# Patient Record
Sex: Female | Born: 1965 | Race: White | Hispanic: No | Marital: Married | Smoking: Former smoker
Health system: Southern US, Community
[De-identification: ages and names within clinical notes are randomized; demographics above are authoritative.]

## PROBLEM LIST (undated history)

## (undated) DIAGNOSIS — E559 Vitamin D deficiency, unspecified: Secondary | ICD-10-CM

## (undated) DIAGNOSIS — N939 Abnormal uterine and vaginal bleeding, unspecified: Secondary | ICD-10-CM

## (undated) DIAGNOSIS — R609 Edema, unspecified: Secondary | ICD-10-CM

## (undated) DIAGNOSIS — A64 Unspecified sexually transmitted disease: Secondary | ICD-10-CM

## (undated) DIAGNOSIS — IMO0002 Reserved for concepts with insufficient information to code with codable children: Secondary | ICD-10-CM

## (undated) HISTORY — DX: Unspecified sexually transmitted disease: A64

## (undated) HISTORY — PX: CLAVICLE SURGERY: SHX598

## (undated) HISTORY — DX: Vitamin D deficiency, unspecified: E55.9

## (undated) HISTORY — DX: Abnormal uterine and vaginal bleeding, unspecified: N93.9

## (undated) HISTORY — DX: Reserved for concepts with insufficient information to code with codable children: IMO0002

## (undated) HISTORY — DX: Edema, unspecified: R60.9

---
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
## 1988-04-05 DIAGNOSIS — IMO0002 Reserved for concepts with insufficient information to code with codable children: Secondary | ICD-10-CM

## 1988-04-05 DIAGNOSIS — R87619 Unspecified abnormal cytological findings in specimens from cervix uteri: Secondary | ICD-10-CM

## 1988-04-05 HISTORY — DX: Unspecified abnormal cytological findings in specimens from cervix uteri: R87.619

## 1988-04-05 HISTORY — PX: GYNECOLOGIC CRYOSURGERY: SHX857

## 1988-04-05 HISTORY — DX: Reserved for concepts with insufficient information to code with codable children: IMO0002

## 1989-04-05 DIAGNOSIS — A64 Unspecified sexually transmitted disease: Secondary | ICD-10-CM

## 1989-04-05 HISTORY — DX: Unspecified sexually transmitted disease: A64

## 1997-07-18 ENCOUNTER — Inpatient Hospital Stay (HOSPITAL_COMMUNITY): Admission: AD | Admit: 1997-07-18 | Discharge: 1997-07-18 | Payer: Self-pay | Admitting: Obstetrics and Gynecology

## 1997-07-20 ENCOUNTER — Inpatient Hospital Stay (HOSPITAL_COMMUNITY): Admission: AD | Admit: 1997-07-20 | Discharge: 1997-07-22 | Payer: Self-pay | Admitting: Gynecology

## 1997-07-31 ENCOUNTER — Encounter: Admission: RE | Admit: 1997-07-31 | Discharge: 1997-10-29 | Payer: Self-pay | Admitting: Obstetrics and Gynecology

## 1999-04-06 HISTORY — PX: TUBAL LIGATION: SHX77

## 1999-06-08 ENCOUNTER — Other Ambulatory Visit: Admission: RE | Admit: 1999-06-08 | Discharge: 1999-06-08 | Payer: Self-pay | Admitting: Gynecology

## 1999-12-24 ENCOUNTER — Inpatient Hospital Stay (HOSPITAL_COMMUNITY): Admission: AD | Admit: 1999-12-24 | Discharge: 1999-12-27 | Payer: Self-pay | Admitting: Gynecology

## 1999-12-24 ENCOUNTER — Encounter (INDEPENDENT_AMBULATORY_CARE_PROVIDER_SITE_OTHER): Payer: Self-pay | Admitting: Specialist

## 2005-03-01 ENCOUNTER — Other Ambulatory Visit: Admission: RE | Admit: 2005-03-01 | Discharge: 2005-03-01 | Payer: Self-pay | Admitting: Gynecology

## 2007-05-31 ENCOUNTER — Encounter: Admission: RE | Admit: 2007-05-31 | Discharge: 2007-05-31 | Payer: Self-pay | Admitting: Family Medicine

## 2008-04-05 HISTORY — PX: WRIST SURGERY: SHX841

## 2009-01-10 ENCOUNTER — Ambulatory Visit (HOSPITAL_COMMUNITY): Admission: RE | Admit: 2009-01-10 | Discharge: 2009-01-12 | Payer: Self-pay | Admitting: Surgery

## 2009-04-05 HISTORY — PX: CARPAL TUNNEL RELEASE: SHX101

## 2009-04-25 ENCOUNTER — Ambulatory Visit (HOSPITAL_COMMUNITY): Admission: RE | Admit: 2009-04-25 | Discharge: 2009-04-25 | Payer: Self-pay | Admitting: Orthopaedic Surgery

## 2010-06-21 LAB — CBC
HCT: 39 % (ref 36.0–46.0)
Hemoglobin: 13.4 g/dL (ref 12.0–15.0)
MCHC: 34.4 g/dL (ref 30.0–36.0)
MCV: 87 fL (ref 78.0–100.0)
Platelets: 213 10*3/uL (ref 150–400)
RBC: 4.49 MIL/uL (ref 3.87–5.11)
RDW: 13.7 % (ref 11.5–15.5)
WBC: 5.6 10*3/uL (ref 4.0–10.5)

## 2010-07-09 LAB — CBC
HCT: 34.9 % — ABNORMAL LOW (ref 36.0–46.0)
Hemoglobin: 11.8 g/dL — ABNORMAL LOW (ref 12.0–15.0)
MCHC: 33.7 g/dL (ref 30.0–36.0)
MCV: 88.2 fL (ref 78.0–100.0)
Platelets: 215 10*3/uL (ref 150–400)
RBC: 3.96 MIL/uL (ref 3.87–5.11)
RDW: 13.7 % (ref 11.5–15.5)
WBC: 6.2 10*3/uL (ref 4.0–10.5)

## 2010-08-21 NOTE — Op Note (Signed)
[facility] [address] [facility]  Patient:    Ariel Ellis, Ariel Ellis                       MRN: [id number] Proc. Date: 12/24/99 Adm. Date:  [id number] Attending:  Douglass Rivers                           Operative Report  PREOPERATIVE DIAGNOSIS:       1. Herpes outbreak at term.                               2. Undesired fertility.  POSTOPERATIVE DIAGNOSIS:      1. Herpes outbreak at term.                               2. Undesired fertility.  OPERATION:                    1. Primary cesarean section, low flap,                                  transverse.                               2. Modified Pomeroy bilateral tubal ligation.  SURGEON:                      Douglass Rivers, M.D.  ASSISTANT:                    Nadyne Coombes. Fontaine, M.D.  ANESTHESIA:                   Spinal.  ESTIMATED BLOOD LOSS:         500 cc.  URINE OUTPUT:                 220 cc of clear urine.  IV FLUIDS:                    2100 cc lactated ringers.  FINDINGS:                     Viable female infant in the vertex presentation. Clear amniotic fluid.  Apgars 9/9.  Birth weight was 7/6.  Normal uterus, tubes, and ovaries.  COMPLICATIONS:                None.  PATHOLOGY:                    Submitted portion of left and right tubes.  DESCRIPTION OF PROCEDURE:     The patient was taken to the operating room. Spinal anesthesia was induced, and the patient was placed in the supine position with left lateral displacement, prepped and draped in usual sterile fashion.  A Pfannenstiel skin incision was made with the scalpel and carried through layer of fascia with electrocautery.  The fascia was scored in the midline and incision was extended laterally.  Mayo scissors in periosteum, fascial incision grasped with Kochers, underlying rectus muscles dissected off using blunt and sharp dissection.  In a similar fashion, superior aspect of incision was grasped with Kochers, underlying rectus  muscles dissected  off. The rectus muscle separated in the midline.  The peritoneum identified and entered by sharp dissection.  The peritoneal incision was extended superiorly to inferiorly with good visualization underlying bowel and bladder. Orientation of the uterus was confirmed and subsequently uterine peritoneum was identified, entered sharply with the Metzenbaum scissors.  Bladder flap was created digitally.  The bladder blade was inserted in the lower uterine segment incised in transverse fashion with scalpel. Clear amniotic fluid was noted upon entering the cavity.  The infant was in the vertex presentation. Cord was cut and clamped.  The infant was handed off to waiting pediatricians. Cord bloods were obtained.  The uterus was massaged, and the placenta was allowed to separate naturally.  The uterus was cleared of all clots and debris.  The uterine incision was repaired with a running lock layer of 0 chromic.  The incision was noted to have a small area of bleeding, and a second single suture figure-of-eight was placed in the mid portion of the incision, and hemostasis was assured.  The uterus was rotated.  The tube was visualized.  The mid portion of the tube was elevated, and two free ties of 2-0 plain were placed, and the mid portion of the tube was excised.  The ends were crossed with a hemostat, and the endosalpinx was excised.  The tube was noted to be hemostatic and was returned back to the abdomen in similar fashion.  The uterus was externally rotated, and the mid portion of the tube was excised.  The pelvis was then irrigated with copious amounts of warm saline.  Hemostasis of the incision was ensured.  There was some mild bleeding underneath the bladder flap which was treated with electrocautery, and hemostasis was assured.  The perineum and muscles were noted to be hemostatic. The fascia was closed with 0 Vicryl starting at the apex and moving towards the midline bilaterally.  The  subcutaneous tissue was irrigated.  Areas of bleeding were treated with electrocautery.  The skin was closed with staples. The patient tolerated the procedure well.  Sponge counts were correct x 2. She was transferred to the PACU in stable condition. DD:  12/24/99 TD:  12/26/99 Job: [id number]

## 2010-08-21 NOTE — Discharge Summary (Signed)
[facility]  Patient:    Ariel Ellis, Ariel Ellis                       MRN: [id number] Adm. Date:  [id number] Disc. Date: [id number] Attending:  Douglass Rivers Dictator:   Antony Contras, [facility]                           Discharge Summary  DISCHARGE DIAGNOSES:          1. Intrauterine pregnancy at 21 and 3/7 weeks.                               2. History of herpes simplex virus with outbreak t                                  37 and 3/7 weeks.  PROCEDURES:                   1. Low cervical transverse cesarean section with                                  delivery of a viable infant.                               2. Tubal sterilization, modified Pomeroy technique.  HISTORY OF PRESENT ILLNESS:   The patient is a 45 year old, gravida 4, para 2-0-1-2 with an LMP March 29, 1999, [facility] January 03, 2000.  Prenatal risk factors include history of HSV, history of PIH with a previous pregnancy.  PRENATAL LABORATORY DATA:     Blood type A positive, antibody screen negative, PR, HBsAg, and HIV nonreactive, rubella immune.  The patient declined MS-AFP.  GBS s negative.  HOSPITAL COURSE:              The patient was admitted for elective cesarean section at 45 and 3/7 weeks after experiencing a recurrence of her HSV at 37 and 3/7 weeks which was resolved at the time of admission for surgery.  Low cervical transverse cesarean section with modified Pomeroy sterilization was performed by Dr. Farrel Gobble assisted by Dr. Audie Box under spinal anesthesia.  The  patient was delivered of an Apgar of 9/9 female infant weighing 7 pounds 6 ounces. Pathology revealed normal uterus, tubes, and ovaries.  Postpartum course:  The patient remained afebrile, had no difficulty voiding, and was able to be discharged on her third postoperative day in satisfactory condition.  LABORATORY DATA:              CBC:  Hematocrit 32.6, hemoglobin 11.3, WBC 7.8, platelets 147.  FOLLOWUP:                      Follow up in the office in six weeks.  DISCHARGE MEDICATIONS:        Continue prenatal vitamins and iron.  DISCHARGE INSTRUCTIONS:       Remove staples next week. DD:  01/19/00 TD:  01/19/00 Job: [id number]

## 2012-05-06 DIAGNOSIS — N939 Abnormal uterine and vaginal bleeding, unspecified: Secondary | ICD-10-CM

## 2012-05-06 HISTORY — DX: Abnormal uterine and vaginal bleeding, unspecified: N93.9

## 2012-05-10 LAB — HM PAP SMEAR: HM Pap smear: NEGATIVE

## 2012-05-31 HISTORY — PX: ENDOMETRIAL BIOPSY: SHX622

## 2012-07-13 ENCOUNTER — Encounter: Payer: Self-pay | Admitting: Obstetrics and Gynecology

## 2012-07-14 ENCOUNTER — Ambulatory Visit (INDEPENDENT_AMBULATORY_CARE_PROVIDER_SITE_OTHER): Payer: BC Managed Care – PPO | Admitting: Obstetrics and Gynecology

## 2012-07-14 ENCOUNTER — Encounter: Payer: Self-pay | Admitting: Obstetrics and Gynecology

## 2012-07-14 VITALS — BP 118/70 | Wt 202.0 lb

## 2012-07-14 DIAGNOSIS — Z30431 Encounter for routine checking of intrauterine contraceptive device: Secondary | ICD-10-CM

## 2012-07-14 NOTE — Patient Instructions (Signed)
Return for annual exam in Feb 2015.    Feel for the strings of your IUD every month and report to [place] if you do not feel them.

## 2012-07-14 NOTE — Progress Notes (Signed)
47 yo MWF G4P3 here for IUD check.  Had Mirena inserted 06/14/2012 for tx of menorrhagia after nl PUS, SHSG, and endo bx.  Has BTSP so doesn't need it for birth control.  No trouble with cramping or bleeding after insertion. Had a very light period since insertion.  Pt pleased with results.    Exam:  abd soft, NT, no masses.  Pelvic:  Ext nl.  Vag nl.  Cx nl, iud strings visualized.  Bimanual exam:  Uterus ant, small, NT, mobile.  Adnexa nl.  A:  Nl post IUD exam  P:  Pt instructed to feel for strings q month.  Return if menorrhagia doesn't respond to IUD.  Keep routine exam appt in Feb 2015.

## 2013-05-16 ENCOUNTER — Ambulatory Visit: Payer: Self-pay | Admitting: Obstetrics and Gynecology

## 2013-05-17 ENCOUNTER — Encounter: Payer: Self-pay | Admitting: Obstetrics and Gynecology

## 2013-05-17 ENCOUNTER — Ambulatory Visit (INDEPENDENT_AMBULATORY_CARE_PROVIDER_SITE_OTHER): Payer: BC Managed Care – PPO | Admitting: Obstetrics and Gynecology

## 2013-05-17 VITALS — BP 127/69 | HR 70 | Resp 16 | Ht 64.5 in | Wt 177.0 lb

## 2013-05-17 DIAGNOSIS — Z0000 Encounter for general adult medical examination without abnormal findings: Secondary | ICD-10-CM

## 2013-05-17 DIAGNOSIS — Z01419 Encounter for gynecological examination (general) (routine) without abnormal findings: Secondary | ICD-10-CM

## 2013-05-17 LAB — POCT URINALYSIS DIPSTICK
Bilirubin, UA: NEGATIVE
Blood, UA: NEGATIVE
Glucose, UA: NEGATIVE
Ketones, UA: NEGATIVE
Leukocytes, UA: NEGATIVE
Nitrite, UA: NEGATIVE
Protein, UA: NEGATIVE
Urobilinogen, UA: NEGATIVE
pH, UA: 6

## 2013-05-17 LAB — LIPID PANEL
Cholesterol: 173 mg/dL (ref 0–200)
HDL: 59 mg/dL (ref >39)
LDL Cholesterol: 94 mg/dL (ref 0–99)
Total CHOL/HDL Ratio: 2.9 Ratio
Triglycerides: 98 mg/dL (ref <150)
VLDL: 20 mg/dL (ref 0–40)

## 2013-05-17 LAB — CBC
HCT: 39.1 % (ref 36.0–46.0)
Hemoglobin: 13.1 g/dL (ref 12.0–15.0)
MCH: 28.8 pg (ref 26.0–34.0)
MCHC: 33.5 g/dL (ref 30.0–36.0)
MCV: 85.9 fL (ref 78.0–100.0)
Platelets: 253 10*3/uL (ref 150–400)
RBC: 4.55 MIL/uL (ref 3.87–5.11)
RDW: 14.5 % (ref 11.5–15.5)
WBC: 5.8 10*3/uL (ref 4.0–10.5)

## 2013-05-17 LAB — COMPREHENSIVE METABOLIC PANEL
ALT: 14 U/L (ref 0–35)
AST: 14 U/L (ref 0–37)
Albumin: 3.9 g/dL (ref 3.5–5.2)
Alkaline Phosphatase: 62 U/L (ref 39–117)
BUN: 11 mg/dL (ref 6–23)
CO2: 30 mEq/L (ref 19–32)
Calcium: 8.7 mg/dL (ref 8.4–10.5)
Chloride: 104 mEq/L (ref 96–112)
Creat: 0.8 mg/dL (ref 0.50–1.10)
Glucose, Bld: 83 mg/dL (ref 70–99)
Potassium: 4.5 mEq/L (ref 3.5–5.3)
Sodium: 141 mEq/L (ref 135–145)
Total Bilirubin: 0.3 mg/dL (ref 0.2–1.2)
Total Protein: 6.2 g/dL (ref 6.0–8.3)

## 2013-05-17 LAB — HEMOGLOBIN, FINGERSTICK: Hemoglobin, fingerstick: 13.1 g/dL (ref 12.0–16.0)

## 2013-05-17 NOTE — Progress Notes (Signed)
Appointment made for screening Mammogram. Appointment made with patient in office for  [facility]  patient agreeable to time/date.
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
## 2013-05-17 NOTE — Patient Instructions (Signed)

## 2013-05-24 ENCOUNTER — Ambulatory Visit: Payer: Self-pay

## 2013-06-05 ENCOUNTER — Ambulatory Visit
Admission: RE | Admit: 2013-06-05 | Discharge: 2013-06-05 | Disposition: A | Discharge status: Home / Self Care | Payer: BC Managed Care – PPO | Source: Ambulatory Visit | Attending: Obstetrics and Gynecology | Admitting: Obstetrics and Gynecology

## 2013-06-05 DIAGNOSIS — Z01419 Encounter for gynecological examination (general) (routine) without abnormal findings: Secondary | ICD-10-CM

## 2014-01-28 ENCOUNTER — Telehealth: Payer: Self-pay | Admitting: Obstetrics and Gynecology

## 2014-01-28 NOTE — Telephone Encounter (Signed)
LMTCB to reschedule AEX from 05/22/14 to another day.

## 2014-02-04 ENCOUNTER — Encounter: Payer: Self-pay | Admitting: Obstetrics and Gynecology

## 2014-05-22 ENCOUNTER — Ambulatory Visit: Payer: BC Managed Care – PPO | Admitting: Obstetrics and Gynecology

## 2015-05-02 ENCOUNTER — Encounter: Payer: Self-pay | Admitting: Obstetrics and Gynecology

## 2015-05-02 ENCOUNTER — Ambulatory Visit (INDEPENDENT_AMBULATORY_CARE_PROVIDER_SITE_OTHER): Payer: BLUE CROSS/BLUE SHIELD | Admitting: Obstetrics and Gynecology

## 2015-05-02 VITALS — BP 110/78 | HR 70 | Resp 16 | Ht 64.75 in | Wt 199.0 lb

## 2015-05-02 DIAGNOSIS — Z0000 Encounter for general adult medical examination without abnormal findings: Secondary | ICD-10-CM

## 2015-05-02 DIAGNOSIS — Z01419 Encounter for gynecological examination (general) (routine) without abnormal findings: Secondary | ICD-10-CM

## 2015-05-02 LAB — POCT URINALYSIS DIPSTICK
Bilirubin, UA: NEGATIVE
Blood, UA: NEGATIVE
Glucose, UA: NEGATIVE
Ketones, UA: NEGATIVE
Leukocytes, UA: NEGATIVE
Nitrite, UA: NEGATIVE
Protein, UA: NEGATIVE
Urobilinogen, UA: NEGATIVE
pH, UA: 5

## 2015-05-02 LAB — CBC
HCT: 40.3 % (ref 36.0–46.0)
Hemoglobin: 13.2 g/dL (ref 12.0–15.0)
MCH: 28.4 pg (ref 26.0–34.0)
MCHC: 32.8 g/dL (ref 30.0–36.0)
MCV: 86.9 fL (ref 78.0–100.0)
MPV: 9.6 fL (ref 8.6–12.4)
Platelets: 280 10*3/uL (ref 150–400)
RBC: 4.64 MIL/uL (ref 3.87–5.11)
RDW: 14.7 % (ref 11.5–15.5)
WBC: 5.7 10*3/uL (ref 4.0–10.5)

## 2015-05-02 LAB — COMPREHENSIVE METABOLIC PANEL
ALT: 14 U/L (ref 6–29)
AST: 15 U/L (ref 10–35)
Albumin: 4 g/dL (ref 3.6–5.1)
Alkaline Phosphatase: 74 U/L (ref 33–115)
BUN: 15 mg/dL (ref 7–25)
CO2: 25 mmol/L (ref 20–31)
Calcium: 8.7 mg/dL (ref 8.6–10.2)
Chloride: 104 mmol/L (ref 98–110)
Creat: 0.7 mg/dL (ref 0.50–1.10)
Glucose, Bld: 81 mg/dL (ref 65–99)
Potassium: 4.3 mmol/L (ref 3.5–5.3)
Sodium: 141 mmol/L (ref 135–146)
Total Bilirubin: 0.3 mg/dL (ref 0.2–1.2)
Total Protein: 6.3 g/dL (ref 6.1–8.1)

## 2015-05-02 LAB — LIPID PANEL
Cholesterol: 173 mg/dL (ref 125–200)
HDL: 60 mg/dL (ref >=46)
LDL Cholesterol: 93 mg/dL (ref <130)
Total CHOL/HDL Ratio: 2.9 Ratio (ref <=5.0)
Triglycerides: 101 mg/dL (ref <150)
VLDL: 20 mg/dL (ref <30)
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
## 2015-05-02 NOTE — Patient Instructions (Signed)

## 2015-05-03 LAB — TSH: TSH: 1.147 u[IU]/mL (ref 0.350–4.500)

## 2015-05-06 LAB — IPS PAP TEST WITH HPV
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
## 2018-01-06 ENCOUNTER — Other Ambulatory Visit: Payer: Self-pay | Admitting: Obstetrics and Gynecology

## 2018-01-06 ENCOUNTER — Other Ambulatory Visit (HOSPITAL_COMMUNITY)
Admission: RE | Admit: 2018-01-06 | Discharge: 2018-01-06 | Disposition: A | Discharge status: Home / Self Care | Payer: Managed Care, Other (non HMO) | Source: Ambulatory Visit | Attending: Obstetrics and Gynecology | Admitting: Obstetrics and Gynecology

## 2018-01-06 ENCOUNTER — Ambulatory Visit (INDEPENDENT_AMBULATORY_CARE_PROVIDER_SITE_OTHER): Payer: Managed Care, Other (non HMO) | Admitting: Obstetrics and Gynecology

## 2018-01-06 ENCOUNTER — Encounter: Payer: Self-pay | Admitting: Obstetrics and Gynecology

## 2018-01-06 ENCOUNTER — Other Ambulatory Visit: Payer: Self-pay

## 2018-01-06 VITALS — BP 118/74 | HR 83 | Ht 64.5 in | Wt 224.0 lb

## 2018-01-06 DIAGNOSIS — Z01419 Encounter for gynecological examination (general) (routine) without abnormal findings: Secondary | ICD-10-CM | POA: Diagnosis present

## 2018-01-06 DIAGNOSIS — Z1231 Encounter for screening mammogram for malignant neoplasm of breast: Secondary | ICD-10-CM

## 2018-01-06 DIAGNOSIS — N939 Abnormal uterine and vaginal bleeding, unspecified: Secondary | ICD-10-CM

## 2018-01-06 DIAGNOSIS — R635 Abnormal weight gain: Secondary | ICD-10-CM

## 2018-01-06 DIAGNOSIS — Z1211 Encounter for screening for malignant neoplasm of colon: Secondary | ICD-10-CM

## 2018-01-06 NOTE — Patient Instructions (Signed)

## 2018-01-07 LAB — COMPREHENSIVE METABOLIC PANEL
ALT: 19 IU/L (ref 0–32)
AST: 16 IU/L (ref 0–40)
Albumin/Globulin Ratio: 2 (ref 1.2–2.2)
Albumin: 4.3 g/dL (ref 3.5–5.5)
Alkaline Phosphatase: 98 IU/L (ref 39–117)
BUN/Creatinine Ratio: 22 (ref 9–23)
BUN: 16 mg/dL (ref 6–24)
Bilirubin Total: <0.2 mg/dL (ref 0.0–1.2)
CO2: 25 mmol/L (ref 20–29)
Calcium: 9 mg/dL (ref 8.7–10.2)
Chloride: 104 mmol/L (ref 96–106)
Creatinine, Ser: 0.74 mg/dL (ref 0.57–1.00)
GFR calc Af Amer: 108 mL/min/{1.73_m2} (ref >59)
GFR calc non Af Amer: 93 mL/min/{1.73_m2} (ref >59)
Globulin, Total: 2.1 g/dL (ref 1.5–4.5)
Glucose: 80 mg/dL (ref 65–99)
Potassium: 4.7 mmol/L (ref 3.5–5.2)
Sodium: 142 mmol/L (ref 134–144)
Total Protein: 6.4 g/dL (ref 6.0–8.5)

## 2018-01-07 LAB — LIPID PANEL
Chol/HDL Ratio: 3.5 ratio (ref 0.0–4.4)
Cholesterol, Total: 188 mg/dL (ref 100–199)
HDL: 53 mg/dL (ref >39)
LDL Calculated: 111 mg/dL — ABNORMAL HIGH (ref 0–99)
Triglycerides: 121 mg/dL (ref 0–149)
VLDL Cholesterol Cal: 24 mg/dL (ref 5–40)

## 2018-01-07 LAB — TSH: TSH: 1.35 u[IU]/mL (ref 0.450–4.500)

## 2018-01-07 LAB — FOLLICLE STIMULATING HORMONE: FSH: 96.9 m[IU]/mL

## 2018-01-07 LAB — CBC
Hematocrit: 41.2 % (ref 34.0–46.6)
Hemoglobin: 13.5 g/dL (ref 11.1–15.9)
MCH: 28.3 pg (ref 26.6–33.0)
MCHC: 32.8 g/dL (ref 31.5–35.7)
MCV: 86 fL (ref 79–97)
Platelets: 272 10*3/uL (ref 150–450)
RBC: 4.77 x10E6/uL (ref 3.77–5.28)
RDW: 13.2 % (ref 12.3–15.4)
WBC: 5.8 10*3/uL (ref 3.4–10.8)

## 2018-01-07 LAB — ESTRADIOL: Estradiol: 16.6 pg/mL

## 2018-01-10 ENCOUNTER — Other Ambulatory Visit: Payer: Self-pay | Admitting: *Deleted

## 2018-01-10 DIAGNOSIS — N950 Postmenopausal bleeding: Secondary | ICD-10-CM

## 2018-01-10 DIAGNOSIS — Z30432 Encounter for removal of intrauterine contraceptive device: Secondary | ICD-10-CM

## 2018-01-10 LAB — CYTOLOGY - PAP
Diagnosis: NEGATIVE
HPV: NOT DETECTED

## 2018-01-12 ENCOUNTER — Other Ambulatory Visit: Payer: Self-pay

## 2018-01-12 ENCOUNTER — Encounter: Payer: Self-pay | Admitting: Obstetrics and Gynecology

## 2018-01-12 ENCOUNTER — Ambulatory Visit: Payer: Managed Care, Other (non HMO) | Admitting: Obstetrics and Gynecology

## 2018-01-12 ENCOUNTER — Ambulatory Visit (INDEPENDENT_AMBULATORY_CARE_PROVIDER_SITE_OTHER): Payer: Managed Care, Other (non HMO)

## 2018-01-12 VITALS — BP 118/78 | HR 70 | Ht 64.5 in | Wt 224.0 lb

## 2018-01-12 DIAGNOSIS — N950 Postmenopausal bleeding: Secondary | ICD-10-CM

## 2018-01-12 DIAGNOSIS — Z0001 Encounter for general adult medical examination with abnormal findings: Secondary | ICD-10-CM | POA: Diagnosis not present

## 2018-01-12 DIAGNOSIS — Z30432 Encounter for removal of intrauterine contraceptive device: Secondary | ICD-10-CM | POA: Diagnosis not present

## 2018-01-12 NOTE — Patient Instructions (Signed)

## 2018-01-12 NOTE — Progress Notes (Signed)
Encounter reviewed by Dr. Magdalen Cabana Amundson C. Silva.  
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
## 2018-01-13 ENCOUNTER — Ambulatory Visit
Admission: RE | Admit: 2018-01-13 | Discharge: 2018-01-13 | Disposition: A | Discharge status: Home / Self Care | Payer: Managed Care, Other (non HMO) | Source: Ambulatory Visit | Attending: Obstetrics and Gynecology | Admitting: Obstetrics and Gynecology

## 2018-01-13 DIAGNOSIS — Z1231 Encounter for screening mammogram for malignant neoplasm of breast: Secondary | ICD-10-CM

## 2018-02-09 ENCOUNTER — Encounter: Payer: Self-pay | Admitting: Obstetrics and Gynecology

## 2018-02-16 ENCOUNTER — Encounter (INDEPENDENT_AMBULATORY_CARE_PROVIDER_SITE_OTHER): Payer: Managed Care, Other (non HMO)

## 2018-02-22 ENCOUNTER — Encounter (INDEPENDENT_AMBULATORY_CARE_PROVIDER_SITE_OTHER): Payer: Self-pay | Admitting: Bariatrics

## 2018-02-22 ENCOUNTER — Ambulatory Visit (INDEPENDENT_AMBULATORY_CARE_PROVIDER_SITE_OTHER): Payer: 59 | Admitting: Bariatrics

## 2018-02-22 VITALS — BP 123/78 | HR 82 | Temp 98.1°F | Ht 65.0 in | Wt 217.0 lb

## 2018-02-22 DIAGNOSIS — R0602 Shortness of breath: Secondary | ICD-10-CM

## 2018-02-22 DIAGNOSIS — R5383 Other fatigue: Secondary | ICD-10-CM

## 2018-02-22 DIAGNOSIS — Z0289 Encounter for other administrative examinations: Secondary | ICD-10-CM

## 2018-02-22 DIAGNOSIS — Z1331 Encounter for screening for depression: Secondary | ICD-10-CM

## 2018-02-22 DIAGNOSIS — Z9189 Other specified personal risk factors, not elsewhere classified: Secondary | ICD-10-CM

## 2018-02-22 DIAGNOSIS — Z6836 Body mass index (BMI) 36.0-36.9, adult: Secondary | ICD-10-CM

## 2018-02-23 LAB — HEMOGLOBIN A1C
Est. average glucose Bld gHb Est-mCnc: 120 mg/dL
Hgb A1c MFr Bld: 5.8 % — ABNORMAL HIGH (ref 4.8–5.6)

## 2018-02-23 LAB — INSULIN, RANDOM: INSULIN: 13.1 u[IU]/mL (ref 2.6–24.9)

## 2018-02-23 LAB — VITAMIN D 25 HYDROXY (VIT D DEFICIENCY, FRACTURES): Vit D, 25-Hydroxy: 23.7 ng/mL — ABNORMAL LOW (ref 30.0–100.0)

## 2018-02-28 DIAGNOSIS — Z6834 Body mass index (BMI) 34.0-34.9, adult: Secondary | ICD-10-CM

## 2018-02-28 DIAGNOSIS — E669 Obesity, unspecified: Secondary | ICD-10-CM | POA: Insufficient documentation
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
## 2018-03-08 ENCOUNTER — Ambulatory Visit (INDEPENDENT_AMBULATORY_CARE_PROVIDER_SITE_OTHER): Payer: Managed Care, Other (non HMO) | Admitting: Bariatrics

## 2018-03-15 ENCOUNTER — Ambulatory Visit (INDEPENDENT_AMBULATORY_CARE_PROVIDER_SITE_OTHER): Payer: 59 | Admitting: Family Medicine

## 2018-03-15 VITALS — BP 118/77 | HR 74 | Temp 98.1°F | Ht 65.0 in | Wt 216.0 lb

## 2018-03-15 DIAGNOSIS — E559 Vitamin D deficiency, unspecified: Secondary | ICD-10-CM | POA: Diagnosis not present

## 2018-03-15 DIAGNOSIS — Z9189 Other specified personal risk factors, not elsewhere classified: Secondary | ICD-10-CM

## 2018-03-15 DIAGNOSIS — Z6836 Body mass index (BMI) 36.0-36.9, adult: Secondary | ICD-10-CM

## 2018-03-15 DIAGNOSIS — R7303 Prediabetes: Secondary | ICD-10-CM | POA: Diagnosis not present

## 2018-03-15 MED ORDER — VITAMIN D (ERGOCALCIFEROL) 1.25 MG (50000 UNIT) PO CAPS: 50000.0000 [IU] | ORAL_CAPSULE | ORAL | 0 refills | Status: DC
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
## 2018-04-06 ENCOUNTER — Ambulatory Visit (INDEPENDENT_AMBULATORY_CARE_PROVIDER_SITE_OTHER): Payer: 59 | Admitting: Family Medicine

## 2018-04-06 ENCOUNTER — Encounter (INDEPENDENT_AMBULATORY_CARE_PROVIDER_SITE_OTHER): Payer: Self-pay | Admitting: Family Medicine

## 2018-04-06 VITALS — BP 116/69 | HR 74 | Temp 97.8°F | Ht 65.0 in | Wt 216.0 lb

## 2018-04-06 DIAGNOSIS — R7303 Prediabetes: Secondary | ICD-10-CM | POA: Diagnosis not present

## 2018-04-06 DIAGNOSIS — Z9189 Other specified personal risk factors, not elsewhere classified: Secondary | ICD-10-CM

## 2018-04-06 DIAGNOSIS — E559 Vitamin D deficiency, unspecified: Secondary | ICD-10-CM | POA: Diagnosis not present

## 2018-04-06 DIAGNOSIS — Z6835 Body mass index (BMI) 35.0-35.9, adult: Secondary | ICD-10-CM | POA: Diagnosis not present

## 2018-04-06 MED ORDER — VITAMIN D (ERGOCALCIFEROL) 1.25 MG (50000 UNIT) PO CAPS: 50000.0000 [IU] | ORAL_CAPSULE | ORAL | 0 refills | Status: DC
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
## 2018-04-18 ENCOUNTER — Encounter (INDEPENDENT_AMBULATORY_CARE_PROVIDER_SITE_OTHER): Payer: Self-pay | Admitting: Family Medicine

## 2018-04-18 ENCOUNTER — Ambulatory Visit (INDEPENDENT_AMBULATORY_CARE_PROVIDER_SITE_OTHER): Payer: 59 | Admitting: Family Medicine

## 2018-04-18 VITALS — BP 117/73 | HR 74 | Temp 97.5°F | Ht 65.0 in | Wt 211.0 lb

## 2018-04-18 DIAGNOSIS — R7303 Prediabetes: Secondary | ICD-10-CM

## 2018-04-18 DIAGNOSIS — E559 Vitamin D deficiency, unspecified: Secondary | ICD-10-CM

## 2018-04-18 DIAGNOSIS — Z6835 Body mass index (BMI) 35.0-35.9, adult: Secondary | ICD-10-CM
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
## 2018-05-04 ENCOUNTER — Encounter (INDEPENDENT_AMBULATORY_CARE_PROVIDER_SITE_OTHER): Payer: Self-pay | Admitting: Family Medicine

## 2018-05-04 ENCOUNTER — Ambulatory Visit (INDEPENDENT_AMBULATORY_CARE_PROVIDER_SITE_OTHER): Payer: 59 | Admitting: Family Medicine

## 2018-05-04 VITALS — BP 124/80 | HR 69 | Temp 97.9°F | Ht 65.0 in | Wt 212.0 lb

## 2018-05-04 DIAGNOSIS — Z6835 Body mass index (BMI) 35.0-35.9, adult: Secondary | ICD-10-CM | POA: Diagnosis not present

## 2018-05-04 DIAGNOSIS — R7303 Prediabetes: Secondary | ICD-10-CM
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
## 2018-05-08 ENCOUNTER — Encounter (INDEPENDENT_AMBULATORY_CARE_PROVIDER_SITE_OTHER): Payer: Self-pay | Admitting: Family Medicine

## 2018-05-08 DIAGNOSIS — R7303 Prediabetes: Secondary | ICD-10-CM | POA: Insufficient documentation

## 2018-05-22 ENCOUNTER — Encounter (INDEPENDENT_AMBULATORY_CARE_PROVIDER_SITE_OTHER): Payer: Self-pay | Admitting: Family Medicine

## 2018-05-22 ENCOUNTER — Ambulatory Visit (INDEPENDENT_AMBULATORY_CARE_PROVIDER_SITE_OTHER): Payer: 59 | Admitting: Family Medicine

## 2018-05-22 VITALS — BP 116/62 | HR 74 | Temp 97.9°F | Ht 65.0 in | Wt 207.0 lb

## 2018-05-22 DIAGNOSIS — Z9189 Other specified personal risk factors, not elsewhere classified: Secondary | ICD-10-CM | POA: Diagnosis not present

## 2018-05-22 DIAGNOSIS — R7303 Prediabetes: Secondary | ICD-10-CM

## 2018-05-22 DIAGNOSIS — Z6834 Body mass index (BMI) 34.0-34.9, adult: Secondary | ICD-10-CM | POA: Diagnosis not present

## 2018-05-22 DIAGNOSIS — E669 Obesity, unspecified: Secondary | ICD-10-CM

## 2018-05-22 DIAGNOSIS — E559 Vitamin D deficiency, unspecified: Secondary | ICD-10-CM | POA: Insufficient documentation

## 2018-05-22 MED ORDER — VITAMIN D (ERGOCALCIFEROL) 1.25 MG (50000 UNIT) PO CAPS: 50000.0000 [IU] | ORAL_CAPSULE | ORAL | 0 refills | Status: DC
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
## 2018-06-12 ENCOUNTER — Encounter (INDEPENDENT_AMBULATORY_CARE_PROVIDER_SITE_OTHER): Payer: Self-pay | Admitting: Family Medicine

## 2018-06-12 ENCOUNTER — Ambulatory Visit (INDEPENDENT_AMBULATORY_CARE_PROVIDER_SITE_OTHER): Payer: 59 | Admitting: Family Medicine

## 2018-06-12 VITALS — BP 113/72 | HR 67 | Temp 97.6°F | Ht 65.0 in | Wt 205.0 lb

## 2018-06-12 DIAGNOSIS — E669 Obesity, unspecified: Secondary | ICD-10-CM | POA: Diagnosis not present

## 2018-06-12 DIAGNOSIS — Z6834 Body mass index (BMI) 34.0-34.9, adult: Secondary | ICD-10-CM | POA: Diagnosis not present

## 2018-06-12 DIAGNOSIS — R7303 Prediabetes: Secondary | ICD-10-CM

## 2018-06-12 DIAGNOSIS — Z9189 Other specified personal risk factors, not elsewhere classified: Secondary | ICD-10-CM | POA: Diagnosis not present

## 2018-06-12 DIAGNOSIS — E7849 Other hyperlipidemia: Secondary | ICD-10-CM

## 2018-06-12 DIAGNOSIS — E559 Vitamin D deficiency, unspecified: Secondary | ICD-10-CM
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
## 2018-06-13 LAB — COMPREHENSIVE METABOLIC PANEL
ALT: 17 IU/L (ref 0–32)
AST: 15 IU/L (ref 0–40)
Albumin/Globulin Ratio: 2.1 (ref 1.2–2.2)
Albumin: 4.2 g/dL (ref 3.8–4.9)
Alkaline Phosphatase: 96 IU/L (ref 39–117)
BUN/Creatinine Ratio: 21 (ref 9–23)
BUN: 16 mg/dL (ref 6–24)
Bilirubin Total: 0.2 mg/dL (ref 0.0–1.2)
CO2: 22 mmol/L (ref 20–29)
Calcium: 9 mg/dL (ref 8.7–10.2)
Chloride: 107 mmol/L — ABNORMAL HIGH (ref 96–106)
Creatinine, Ser: 0.77 mg/dL (ref 0.57–1.00)
GFR calc Af Amer: 103 mL/min/{1.73_m2} (ref >59)
GFR calc non Af Amer: 89 mL/min/{1.73_m2} (ref >59)
Globulin, Total: 2 g/dL (ref 1.5–4.5)
Glucose: 90 mg/dL (ref 65–99)
Potassium: 4.4 mmol/L (ref 3.5–5.2)
Sodium: 143 mmol/L (ref 134–144)
Total Protein: 6.2 g/dL (ref 6.0–8.5)

## 2018-06-13 LAB — LIPID PANEL WITH LDL/HDL RATIO
Cholesterol, Total: 196 mg/dL (ref 100–199)
HDL: 52 mg/dL (ref >39)
LDL Calculated: 123 mg/dL — ABNORMAL HIGH (ref 0–99)
LDl/HDL Ratio: 2.4 ratio (ref 0.0–3.2)
Triglycerides: 107 mg/dL (ref 0–149)
VLDL Cholesterol Cal: 21 mg/dL (ref 5–40)

## 2018-06-13 LAB — VITAMIN D 25 HYDROXY (VIT D DEFICIENCY, FRACTURES): Vit D, 25-Hydroxy: 44.5 ng/mL (ref 30.0–100.0)

## 2018-06-13 LAB — INSULIN, RANDOM: INSULIN: 10.3 u[IU]/mL (ref 2.6–24.9)

## 2018-06-13 LAB — HEMOGLOBIN A1C
Est. average glucose Bld gHb Est-mCnc: 123 mg/dL
Hgb A1c MFr Bld: 5.9 % — ABNORMAL HIGH (ref 4.8–5.6)

## 2018-06-27 ENCOUNTER — Encounter (INDEPENDENT_AMBULATORY_CARE_PROVIDER_SITE_OTHER): Payer: Self-pay

## 2018-06-29 ENCOUNTER — Encounter (INDEPENDENT_AMBULATORY_CARE_PROVIDER_SITE_OTHER): Payer: Self-pay | Admitting: Family Medicine

## 2018-06-29 ENCOUNTER — Ambulatory Visit (INDEPENDENT_AMBULATORY_CARE_PROVIDER_SITE_OTHER): Payer: 59 | Admitting: Family Medicine

## 2018-06-29 ENCOUNTER — Other Ambulatory Visit: Payer: Self-pay

## 2018-06-29 DIAGNOSIS — E559 Vitamin D deficiency, unspecified: Secondary | ICD-10-CM

## 2018-06-29 DIAGNOSIS — E669 Obesity, unspecified: Secondary | ICD-10-CM

## 2018-06-29 DIAGNOSIS — R7303 Prediabetes: Secondary | ICD-10-CM

## 2018-06-29 DIAGNOSIS — Z6834 Body mass index (BMI) 34.0-34.9, adult: Secondary | ICD-10-CM | POA: Diagnosis not present

## 2018-06-29 MED ORDER — VITAMIN D (ERGOCALCIFEROL) 1.25 MG (50000 UNIT) PO CAPS: 50000.0000 [IU] | ORAL_CAPSULE | ORAL | 0 refills | Status: DC
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
## 2018-07-13 ENCOUNTER — Other Ambulatory Visit: Payer: Self-pay

## 2018-07-13 ENCOUNTER — Ambulatory Visit (INDEPENDENT_AMBULATORY_CARE_PROVIDER_SITE_OTHER): Payer: 59 | Admitting: Family Medicine

## 2018-07-13 ENCOUNTER — Encounter (INDEPENDENT_AMBULATORY_CARE_PROVIDER_SITE_OTHER): Payer: Self-pay | Admitting: Family Medicine

## 2018-07-13 DIAGNOSIS — E7849 Other hyperlipidemia: Secondary | ICD-10-CM | POA: Diagnosis not present

## 2018-07-13 DIAGNOSIS — E559 Vitamin D deficiency, unspecified: Secondary | ICD-10-CM

## 2018-07-13 DIAGNOSIS — E669 Obesity, unspecified: Secondary | ICD-10-CM

## 2018-07-13 DIAGNOSIS — Z6834 Body mass index (BMI) 34.0-34.9, adult: Secondary | ICD-10-CM | POA: Diagnosis not present
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
## 2018-07-21 ENCOUNTER — Other Ambulatory Visit (INDEPENDENT_AMBULATORY_CARE_PROVIDER_SITE_OTHER): Payer: Self-pay | Admitting: Family Medicine

## 2018-07-21 DIAGNOSIS — E559 Vitamin D deficiency, unspecified: Secondary | ICD-10-CM

## 2018-07-26 ENCOUNTER — Ambulatory Visit (INDEPENDENT_AMBULATORY_CARE_PROVIDER_SITE_OTHER): Payer: 59 | Admitting: Family Medicine

## 2018-07-26 ENCOUNTER — Encounter (INDEPENDENT_AMBULATORY_CARE_PROVIDER_SITE_OTHER): Payer: Self-pay | Admitting: Family Medicine

## 2018-07-26 ENCOUNTER — Other Ambulatory Visit: Payer: Self-pay

## 2018-07-26 DIAGNOSIS — Z6834 Body mass index (BMI) 34.0-34.9, adult: Secondary | ICD-10-CM | POA: Diagnosis not present

## 2018-07-26 DIAGNOSIS — E559 Vitamin D deficiency, unspecified: Secondary | ICD-10-CM

## 2018-07-26 DIAGNOSIS — E669 Obesity, unspecified: Secondary | ICD-10-CM | POA: Diagnosis not present

## 2018-07-26 DIAGNOSIS — R7303 Prediabetes: Secondary | ICD-10-CM | POA: Diagnosis not present

## 2018-07-27 MED ORDER — VITAMIN D (ERGOCALCIFEROL) 1.25 MG (50000 UNIT) PO CAPS: 50000.0000 [IU] | ORAL_CAPSULE | ORAL | 0 refills | Status: DC
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
## 2018-08-09 ENCOUNTER — Other Ambulatory Visit: Payer: Self-pay

## 2018-08-09 ENCOUNTER — Ambulatory Visit (INDEPENDENT_AMBULATORY_CARE_PROVIDER_SITE_OTHER): Payer: 59 | Admitting: Family Medicine

## 2018-08-09 ENCOUNTER — Encounter (INDEPENDENT_AMBULATORY_CARE_PROVIDER_SITE_OTHER): Payer: Self-pay | Admitting: Family Medicine

## 2018-08-09 DIAGNOSIS — E669 Obesity, unspecified: Secondary | ICD-10-CM

## 2018-08-09 DIAGNOSIS — R7303 Prediabetes: Secondary | ICD-10-CM | POA: Diagnosis not present

## 2018-08-09 DIAGNOSIS — Z6834 Body mass index (BMI) 34.0-34.9, adult: Secondary | ICD-10-CM | POA: Diagnosis not present

## 2018-08-09 DIAGNOSIS — E559 Vitamin D deficiency, unspecified: Secondary | ICD-10-CM | POA: Diagnosis not present
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
## 2018-08-16 ENCOUNTER — Other Ambulatory Visit (INDEPENDENT_AMBULATORY_CARE_PROVIDER_SITE_OTHER): Payer: Self-pay | Admitting: Family Medicine

## 2018-08-16 DIAGNOSIS — E559 Vitamin D deficiency, unspecified: Secondary | ICD-10-CM

## 2018-08-24 ENCOUNTER — Ambulatory Visit (INDEPENDENT_AMBULATORY_CARE_PROVIDER_SITE_OTHER): Payer: 59 | Admitting: Family Medicine

## 2018-08-24 ENCOUNTER — Other Ambulatory Visit: Payer: Self-pay

## 2018-08-24 ENCOUNTER — Encounter (INDEPENDENT_AMBULATORY_CARE_PROVIDER_SITE_OTHER): Payer: Self-pay | Admitting: Family Medicine

## 2018-08-24 DIAGNOSIS — E669 Obesity, unspecified: Secondary | ICD-10-CM | POA: Diagnosis not present

## 2018-08-24 DIAGNOSIS — Z6834 Body mass index (BMI) 34.0-34.9, adult: Secondary | ICD-10-CM | POA: Diagnosis not present

## 2018-08-24 DIAGNOSIS — E559 Vitamin D deficiency, unspecified: Secondary | ICD-10-CM | POA: Diagnosis not present

## 2018-08-24 DIAGNOSIS — E7849 Other hyperlipidemia: Secondary | ICD-10-CM

## 2018-08-24 MED ORDER — VITAMIN D (ERGOCALCIFEROL) 1.25 MG (50000 UNIT) PO CAPS: 50000.0000 [IU] | ORAL_CAPSULE | ORAL | 0 refills | Status: DC
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
## 2018-09-13 ENCOUNTER — Ambulatory Visit (INDEPENDENT_AMBULATORY_CARE_PROVIDER_SITE_OTHER): Payer: 59 | Admitting: Family Medicine

## 2018-09-13 ENCOUNTER — Other Ambulatory Visit: Payer: Self-pay

## 2018-09-13 ENCOUNTER — Encounter (INDEPENDENT_AMBULATORY_CARE_PROVIDER_SITE_OTHER): Payer: Self-pay | Admitting: Family Medicine

## 2018-09-13 VITALS — BP 126/75 | HR 71 | Temp 98.4°F | Ht 65.0 in | Wt 186.0 lb

## 2018-09-13 DIAGNOSIS — E669 Obesity, unspecified: Secondary | ICD-10-CM

## 2018-09-13 DIAGNOSIS — Z6831 Body mass index (BMI) 31.0-31.9, adult: Secondary | ICD-10-CM | POA: Diagnosis not present

## 2018-09-13 DIAGNOSIS — Z9189 Other specified personal risk factors, not elsewhere classified: Secondary | ICD-10-CM

## 2018-09-13 DIAGNOSIS — E7849 Other hyperlipidemia: Secondary | ICD-10-CM | POA: Diagnosis not present

## 2018-09-13 DIAGNOSIS — E559 Vitamin D deficiency, unspecified: Secondary | ICD-10-CM

## 2018-09-13 DIAGNOSIS — R7303 Prediabetes: Secondary | ICD-10-CM
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
## 2018-09-15 ENCOUNTER — Other Ambulatory Visit (INDEPENDENT_AMBULATORY_CARE_PROVIDER_SITE_OTHER): Payer: Self-pay | Admitting: Family Medicine

## 2018-09-15 DIAGNOSIS — E559 Vitamin D deficiency, unspecified: Secondary | ICD-10-CM

## 2018-10-02 ENCOUNTER — Telehealth (INDEPENDENT_AMBULATORY_CARE_PROVIDER_SITE_OTHER): Payer: 59 | Admitting: Family Medicine

## 2018-10-02 ENCOUNTER — Other Ambulatory Visit: Payer: Self-pay

## 2018-10-02 ENCOUNTER — Encounter (INDEPENDENT_AMBULATORY_CARE_PROVIDER_SITE_OTHER): Payer: Self-pay | Admitting: Family Medicine

## 2018-10-02 DIAGNOSIS — E559 Vitamin D deficiency, unspecified: Secondary | ICD-10-CM | POA: Diagnosis not present

## 2018-10-02 DIAGNOSIS — Z6831 Body mass index (BMI) 31.0-31.9, adult: Secondary | ICD-10-CM

## 2018-10-02 DIAGNOSIS — R7303 Prediabetes: Secondary | ICD-10-CM

## 2018-10-02 DIAGNOSIS — E669 Obesity, unspecified: Secondary | ICD-10-CM
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
## 2018-10-05 ENCOUNTER — Encounter (INDEPENDENT_AMBULATORY_CARE_PROVIDER_SITE_OTHER): Payer: Self-pay | Admitting: Family Medicine

## 2018-10-16 ENCOUNTER — Encounter (INDEPENDENT_AMBULATORY_CARE_PROVIDER_SITE_OTHER): Payer: Self-pay | Admitting: Family Medicine

## 2018-10-16 ENCOUNTER — Other Ambulatory Visit: Payer: Self-pay

## 2018-10-16 ENCOUNTER — Ambulatory Visit (INDEPENDENT_AMBULATORY_CARE_PROVIDER_SITE_OTHER): Payer: 59 | Admitting: Family Medicine

## 2018-10-16 DIAGNOSIS — Z6831 Body mass index (BMI) 31.0-31.9, adult: Secondary | ICD-10-CM

## 2018-10-16 DIAGNOSIS — E669 Obesity, unspecified: Secondary | ICD-10-CM

## 2018-10-16 DIAGNOSIS — R7303 Prediabetes: Secondary | ICD-10-CM

## 2018-10-16 DIAGNOSIS — E559 Vitamin D deficiency, unspecified: Secondary | ICD-10-CM

## 2018-10-16 MED ORDER — VITAMIN D (ERGOCALCIFEROL) 1.25 MG (50000 UNIT) PO CAPS: 50000.0000 [IU] | ORAL_CAPSULE | ORAL | 0 refills | Status: DC

## 2018-10-17 LAB — LIPID PANEL WITH LDL/HDL RATIO
Cholesterol, Total: 174 mg/dL (ref 100–199)
HDL: 54 mg/dL (ref >39)
LDL Calculated: 101 mg/dL — ABNORMAL HIGH (ref 0–99)
LDl/HDL Ratio: 1.9 ratio (ref 0.0–3.2)
Triglycerides: 93 mg/dL (ref 0–149)
VLDL Cholesterol Cal: 19 mg/dL (ref 5–40)

## 2018-10-17 LAB — INSULIN, RANDOM: INSULIN: 26.3 u[IU]/mL — ABNORMAL HIGH (ref 2.6–24.9)

## 2018-10-17 LAB — HEMOGLOBIN A1C
Est. average glucose Bld gHb Est-mCnc: 120 mg/dL
Hgb A1c MFr Bld: 5.8 % — ABNORMAL HIGH (ref 4.8–5.6)

## 2018-10-17 LAB — VITAMIN D 25 HYDROXY (VIT D DEFICIENCY, FRACTURES): Vit D, 25-Hydroxy: 46.2 ng/mL (ref 30.0–100.0)
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
## 2018-10-26 ENCOUNTER — Ambulatory Visit (INDEPENDENT_AMBULATORY_CARE_PROVIDER_SITE_OTHER): Payer: 59 | Admitting: Family Medicine

## 2018-10-30 ENCOUNTER — Other Ambulatory Visit: Payer: Self-pay

## 2018-10-30 ENCOUNTER — Encounter (INDEPENDENT_AMBULATORY_CARE_PROVIDER_SITE_OTHER): Payer: Self-pay | Admitting: Family Medicine

## 2018-10-30 ENCOUNTER — Ambulatory Visit (INDEPENDENT_AMBULATORY_CARE_PROVIDER_SITE_OTHER): Payer: Managed Care, Other (non HMO) | Admitting: Family Medicine

## 2018-10-30 DIAGNOSIS — E559 Vitamin D deficiency, unspecified: Secondary | ICD-10-CM | POA: Diagnosis not present

## 2018-10-30 DIAGNOSIS — Z6831 Body mass index (BMI) 31.0-31.9, adult: Secondary | ICD-10-CM

## 2018-10-30 DIAGNOSIS — R7303 Prediabetes: Secondary | ICD-10-CM

## 2018-10-30 DIAGNOSIS — E669 Obesity, unspecified: Secondary | ICD-10-CM | POA: Diagnosis not present
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
## 2018-11-14 ENCOUNTER — Other Ambulatory Visit (INDEPENDENT_AMBULATORY_CARE_PROVIDER_SITE_OTHER): Payer: Self-pay | Admitting: Family Medicine

## 2018-11-14 DIAGNOSIS — E559 Vitamin D deficiency, unspecified: Secondary | ICD-10-CM

## 2018-11-16 ENCOUNTER — Encounter (INDEPENDENT_AMBULATORY_CARE_PROVIDER_SITE_OTHER): Payer: Self-pay | Admitting: Family Medicine

## 2018-11-16 ENCOUNTER — Telehealth (INDEPENDENT_AMBULATORY_CARE_PROVIDER_SITE_OTHER): Payer: Managed Care, Other (non HMO) | Admitting: Family Medicine

## 2018-11-16 ENCOUNTER — Other Ambulatory Visit: Payer: Self-pay

## 2018-11-16 DIAGNOSIS — E559 Vitamin D deficiency, unspecified: Secondary | ICD-10-CM

## 2018-11-16 DIAGNOSIS — E7849 Other hyperlipidemia: Secondary | ICD-10-CM

## 2018-11-16 DIAGNOSIS — E669 Obesity, unspecified: Secondary | ICD-10-CM

## 2018-11-16 DIAGNOSIS — Z6831 Body mass index (BMI) 31.0-31.9, adult: Secondary | ICD-10-CM | POA: Diagnosis not present
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
## 2018-11-30 ENCOUNTER — Telehealth (INDEPENDENT_AMBULATORY_CARE_PROVIDER_SITE_OTHER): Payer: Managed Care, Other (non HMO) | Admitting: Family Medicine

## 2018-11-30 ENCOUNTER — Encounter (INDEPENDENT_AMBULATORY_CARE_PROVIDER_SITE_OTHER): Payer: Self-pay | Admitting: Family Medicine

## 2018-11-30 ENCOUNTER — Other Ambulatory Visit: Payer: Self-pay

## 2018-11-30 DIAGNOSIS — R7303 Prediabetes: Secondary | ICD-10-CM

## 2018-11-30 DIAGNOSIS — Z6831 Body mass index (BMI) 31.0-31.9, adult: Secondary | ICD-10-CM | POA: Diagnosis not present

## 2018-11-30 DIAGNOSIS — E669 Obesity, unspecified: Secondary | ICD-10-CM

## 2018-11-30 DIAGNOSIS — E559 Vitamin D deficiency, unspecified: Secondary | ICD-10-CM
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
## 2018-12-19 ENCOUNTER — Ambulatory Visit (INDEPENDENT_AMBULATORY_CARE_PROVIDER_SITE_OTHER): Payer: Managed Care, Other (non HMO) | Admitting: Family Medicine

## 2018-12-19 ENCOUNTER — Other Ambulatory Visit: Payer: Self-pay

## 2018-12-19 ENCOUNTER — Encounter: Payer: Self-pay | Admitting: Family Medicine

## 2018-12-19 VITALS — BP 107/72 | HR 71 | Temp 98.0°F | Ht 65.0 in | Wt 183.0 lb

## 2018-12-19 DIAGNOSIS — R0602 Shortness of breath: Secondary | ICD-10-CM

## 2018-12-19 DIAGNOSIS — E669 Obesity, unspecified: Secondary | ICD-10-CM

## 2018-12-19 DIAGNOSIS — Z9189 Other specified personal risk factors, not elsewhere classified: Secondary | ICD-10-CM

## 2018-12-19 DIAGNOSIS — R7303 Prediabetes: Secondary | ICD-10-CM | POA: Diagnosis not present

## 2018-12-19 DIAGNOSIS — Z6830 Body mass index (BMI) 30.0-30.9, adult: Secondary | ICD-10-CM
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
## 2019-01-02 ENCOUNTER — Telehealth (INDEPENDENT_AMBULATORY_CARE_PROVIDER_SITE_OTHER): Payer: Managed Care, Other (non HMO) | Admitting: Family Medicine

## 2019-01-02 ENCOUNTER — Other Ambulatory Visit: Payer: Self-pay

## 2019-01-02 DIAGNOSIS — E669 Obesity, unspecified: Secondary | ICD-10-CM

## 2019-01-02 DIAGNOSIS — E7849 Other hyperlipidemia: Secondary | ICD-10-CM | POA: Diagnosis not present

## 2019-01-02 DIAGNOSIS — Z6830 Body mass index (BMI) 30.0-30.9, adult: Secondary | ICD-10-CM | POA: Diagnosis not present

## 2019-01-02 DIAGNOSIS — E559 Vitamin D deficiency, unspecified: Secondary | ICD-10-CM

## 2019-01-03 ENCOUNTER — Other Ambulatory Visit: Payer: Self-pay
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
## 2019-01-08 ENCOUNTER — Encounter: Payer: Self-pay | Admitting: Obstetrics and Gynecology

## 2019-01-08 ENCOUNTER — Ambulatory Visit: Payer: Managed Care, Other (non HMO) | Admitting: Obstetrics and Gynecology

## 2019-01-08 ENCOUNTER — Other Ambulatory Visit: Payer: Self-pay

## 2019-01-08 VITALS — BP 120/78 | HR 66 | Temp 97.5°F | Resp 16 | Ht 64.5 in | Wt 189.0 lb

## 2019-01-08 DIAGNOSIS — Z119 Encounter for screening for infectious and parasitic diseases, unspecified: Secondary | ICD-10-CM | POA: Diagnosis not present

## 2019-01-08 DIAGNOSIS — Z01419 Encounter for gynecological examination (general) (routine) without abnormal findings: Secondary | ICD-10-CM | POA: Diagnosis not present

## 2019-01-08 DIAGNOSIS — Z1211 Encounter for screening for malignant neoplasm of colon: Secondary | ICD-10-CM

## 2019-01-08 NOTE — Patient Instructions (Signed)

## 2019-01-09 LAB — HEPATITIS C ANTIBODY: Hep C Virus Ab: <0.1 s/co ratio (ref 0.0–0.9)

## 2019-01-16 ENCOUNTER — Encounter (INDEPENDENT_AMBULATORY_CARE_PROVIDER_SITE_OTHER): Payer: Self-pay | Admitting: Family Medicine

## 2019-01-16 ENCOUNTER — Ambulatory Visit (INDEPENDENT_AMBULATORY_CARE_PROVIDER_SITE_OTHER): Payer: Managed Care, Other (non HMO) | Admitting: Family Medicine

## 2019-01-16 ENCOUNTER — Other Ambulatory Visit: Payer: Self-pay | Admitting: Obstetrics and Gynecology

## 2019-01-16 ENCOUNTER — Other Ambulatory Visit: Payer: Self-pay

## 2019-01-16 DIAGNOSIS — E559 Vitamin D deficiency, unspecified: Secondary | ICD-10-CM | POA: Diagnosis not present

## 2019-01-16 DIAGNOSIS — E669 Obesity, unspecified: Secondary | ICD-10-CM

## 2019-01-16 DIAGNOSIS — E7849 Other hyperlipidemia: Secondary | ICD-10-CM | POA: Diagnosis not present

## 2019-01-16 DIAGNOSIS — Z6830 Body mass index (BMI) 30.0-30.9, adult: Secondary | ICD-10-CM

## 2019-01-16 DIAGNOSIS — Z1231 Encounter for screening mammogram for malignant neoplasm of breast: Secondary | ICD-10-CM
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
## 2019-01-30 ENCOUNTER — Other Ambulatory Visit: Payer: Self-pay

## 2019-01-30 ENCOUNTER — Encounter (INDEPENDENT_AMBULATORY_CARE_PROVIDER_SITE_OTHER): Payer: Self-pay | Admitting: Family Medicine

## 2019-01-30 ENCOUNTER — Telehealth (INDEPENDENT_AMBULATORY_CARE_PROVIDER_SITE_OTHER): Payer: Managed Care, Other (non HMO) | Admitting: Family Medicine

## 2019-01-30 DIAGNOSIS — E559 Vitamin D deficiency, unspecified: Secondary | ICD-10-CM | POA: Diagnosis not present

## 2019-01-30 DIAGNOSIS — R7303 Prediabetes: Secondary | ICD-10-CM

## 2019-01-30 DIAGNOSIS — E669 Obesity, unspecified: Secondary | ICD-10-CM | POA: Diagnosis not present

## 2019-01-30 DIAGNOSIS — Z9189 Other specified personal risk factors, not elsewhere classified: Secondary | ICD-10-CM | POA: Diagnosis not present

## 2019-01-30 DIAGNOSIS — Z6830 Body mass index (BMI) 30.0-30.9, adult: Secondary | ICD-10-CM

## 2019-01-30 MED ORDER — VITAMIN D (ERGOCALCIFEROL) 1.25 MG (50000 UNIT) PO CAPS: 50000.0000 [IU] | ORAL_CAPSULE | ORAL | 0 refills | Status: DC
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
## 2019-02-14 ENCOUNTER — Encounter (INDEPENDENT_AMBULATORY_CARE_PROVIDER_SITE_OTHER): Payer: Self-pay | Admitting: Family Medicine

## 2019-02-14 ENCOUNTER — Ambulatory Visit (INDEPENDENT_AMBULATORY_CARE_PROVIDER_SITE_OTHER): Payer: Managed Care, Other (non HMO) | Admitting: Family Medicine

## 2019-02-14 ENCOUNTER — Other Ambulatory Visit: Payer: Self-pay

## 2019-02-14 DIAGNOSIS — Z6830 Body mass index (BMI) 30.0-30.9, adult: Secondary | ICD-10-CM

## 2019-02-14 DIAGNOSIS — E669 Obesity, unspecified: Secondary | ICD-10-CM | POA: Diagnosis not present

## 2019-02-14 DIAGNOSIS — R7303 Prediabetes: Secondary | ICD-10-CM

## 2019-02-15 ENCOUNTER — Encounter (INDEPENDENT_AMBULATORY_CARE_PROVIDER_SITE_OTHER): Payer: Self-pay | Admitting: Family Medicine
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
## 2019-03-05 ENCOUNTER — Other Ambulatory Visit: Payer: Self-pay

## 2019-03-05 ENCOUNTER — Ambulatory Visit
Admission: RE | Admit: 2019-03-05 | Discharge: 2019-03-05 | Disposition: A | Discharge status: Home / Self Care | Payer: 59 | Source: Ambulatory Visit | Attending: Obstetrics and Gynecology | Admitting: Obstetrics and Gynecology

## 2019-03-05 DIAGNOSIS — Z1231 Encounter for screening mammogram for malignant neoplasm of breast: Secondary | ICD-10-CM

## 2019-03-07 ENCOUNTER — Ambulatory Visit (INDEPENDENT_AMBULATORY_CARE_PROVIDER_SITE_OTHER): Payer: 59 | Admitting: Family Medicine

## 2019-03-07 ENCOUNTER — Other Ambulatory Visit: Payer: Self-pay

## 2019-03-07 ENCOUNTER — Encounter (INDEPENDENT_AMBULATORY_CARE_PROVIDER_SITE_OTHER): Payer: Self-pay | Admitting: Family Medicine

## 2019-03-07 VITALS — BP 113/68 | HR 76 | Temp 98.3°F | Ht 65.0 in | Wt 180.0 lb

## 2019-03-07 DIAGNOSIS — R7303 Prediabetes: Secondary | ICD-10-CM

## 2019-03-07 DIAGNOSIS — Z6830 Body mass index (BMI) 30.0-30.9, adult: Secondary | ICD-10-CM

## 2019-03-07 DIAGNOSIS — Z9189 Other specified personal risk factors, not elsewhere classified: Secondary | ICD-10-CM

## 2019-03-07 DIAGNOSIS — R0602 Shortness of breath: Secondary | ICD-10-CM

## 2019-03-07 DIAGNOSIS — E669 Obesity, unspecified: Secondary | ICD-10-CM

## 2019-03-07 DIAGNOSIS — E559 Vitamin D deficiency, unspecified: Secondary | ICD-10-CM | POA: Diagnosis not present

## 2019-03-08 LAB — HEMOGLOBIN A1C
Est. average glucose Bld gHb Est-mCnc: 114 mg/dL
Hgb A1c MFr Bld: 5.6 % (ref 4.8–5.6)

## 2019-03-08 LAB — T4, FREE: Free T4: 1.15 ng/dL (ref 0.82–1.77)

## 2019-03-08 LAB — VITAMIN D 25 HYDROXY (VIT D DEFICIENCY, FRACTURES): Vit D, 25-Hydroxy: 42.9 ng/mL (ref 30.0–100.0)

## 2019-03-08 LAB — T3: T3, Total: 94 ng/dL (ref 71–180)

## 2019-03-08 LAB — TSH: TSH: 1.34 u[IU]/mL (ref 0.450–4.500)
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
## 2019-03-12 ENCOUNTER — Encounter (INDEPENDENT_AMBULATORY_CARE_PROVIDER_SITE_OTHER): Payer: Self-pay | Admitting: Family Medicine

## 2019-03-22 ENCOUNTER — Encounter (INDEPENDENT_AMBULATORY_CARE_PROVIDER_SITE_OTHER): Payer: Self-pay | Admitting: Family Medicine

## 2019-03-22 ENCOUNTER — Telehealth (INDEPENDENT_AMBULATORY_CARE_PROVIDER_SITE_OTHER): Payer: Managed Care, Other (non HMO) | Admitting: Family Medicine

## 2019-03-22 ENCOUNTER — Other Ambulatory Visit: Payer: Self-pay

## 2019-03-22 DIAGNOSIS — Z6830 Body mass index (BMI) 30.0-30.9, adult: Secondary | ICD-10-CM | POA: Diagnosis not present

## 2019-03-22 DIAGNOSIS — E669 Obesity, unspecified: Secondary | ICD-10-CM | POA: Diagnosis not present

## 2019-03-22 DIAGNOSIS — E8881 Metabolic syndrome: Secondary | ICD-10-CM | POA: Diagnosis not present

## 2019-03-22 DIAGNOSIS — E559 Vitamin D deficiency, unspecified: Secondary | ICD-10-CM | POA: Diagnosis not present

## 2019-03-22 MED ORDER — VITAMIN D (ERGOCALCIFEROL) 1.25 MG (50000 UNIT) PO CAPS: 50000.0000 [IU] | ORAL_CAPSULE | ORAL | 0 refills | Status: DC
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
## 2019-04-01 DIAGNOSIS — E8881 Metabolic syndrome: Secondary | ICD-10-CM | POA: Insufficient documentation

## 2019-04-01 DIAGNOSIS — E88819 Insulin resistance, unspecified: Secondary | ICD-10-CM | POA: Insufficient documentation

## 2019-04-10 ENCOUNTER — Telehealth (INDEPENDENT_AMBULATORY_CARE_PROVIDER_SITE_OTHER): Payer: 59 | Admitting: Family Medicine

## 2019-04-19 ENCOUNTER — Encounter (INDEPENDENT_AMBULATORY_CARE_PROVIDER_SITE_OTHER): Payer: Self-pay | Admitting: Family Medicine

## 2019-04-19 ENCOUNTER — Telehealth (INDEPENDENT_AMBULATORY_CARE_PROVIDER_SITE_OTHER): Payer: Managed Care, Other (non HMO) | Admitting: Family Medicine

## 2019-04-19 ENCOUNTER — Other Ambulatory Visit: Payer: Self-pay

## 2019-04-19 DIAGNOSIS — E559 Vitamin D deficiency, unspecified: Secondary | ICD-10-CM

## 2019-04-19 DIAGNOSIS — Z6830 Body mass index (BMI) 30.0-30.9, adult: Secondary | ICD-10-CM

## 2019-04-19 DIAGNOSIS — E669 Obesity, unspecified: Secondary | ICD-10-CM | POA: Diagnosis not present

## 2019-04-19 DIAGNOSIS — E8881 Metabolic syndrome: Secondary | ICD-10-CM | POA: Diagnosis not present

## 2019-04-19 MED ORDER — VITAMIN D (ERGOCALCIFEROL) 1.25 MG (50000 UNIT) PO CAPS: 50000.0000 [IU] | ORAL_CAPSULE | ORAL | 0 refills | Status: DC

## 2019-04-23 NOTE — Progress Notes (Signed)
TeleHealth Visit:  Due to the COVID-19 pandemic, this visit was completed with telemedicine (audio/video) technology to reduce patient and provider exposure as well as to preserve personal protective equipment.   Ariel Ellis has verbally consented to this TeleHealth visit. The patient is located at home, the provider is located at the [facility] office. The participants in this visit include the listed provider and patient and any and all parties involved. The visit was conducted today via FaceTime.  Chief Complaint: OBESITY Ariel Ellis is here to discuss her progress with her obesity treatment plan along with follow-up of her obesity related diagnoses. Ariel Ellis is the Category 1 Plan +100 calories and states she is following her eating plan approximately 50% of the time. Ariel Ellis states she is walking 50 minutes 5 times per week.  Today's visit was #: 26 Starting weight: 217 lbs Starting date: 02/22/2018  Interim History: Ariel Ellis reports that she has gained 3 pounds and she weighs 180 pounds at home today. She was off the plan some over the holidays. She cooked quite a bit and was sporadic with following the plan. Ariel Ellis is trying to get back on the plan.  Subjective:   Vitamin D deficiency Ariel Ellis has worsening vitamin D deficiency. Her last Vitamin D level was not at goal. Her last vitamin D level was 42.9 on 03/07/19, and it had decreased from 46.2. She is on prescription vit D weekly. She denies nausea, vomiting or muscle weakness.  Insulin resistance Meeghan has a diagnosis of insulin resistance based on her elevated fasting insulin level >5. She is having some polyphagia after indulging in more carbohydrates over the holidays. She continues to work on diet and exercise to decrease her risk of diabetes. Metformin was offered to patient today. Patient declines metformin.  Lab Results  Component Value Date   INSULIN 26.3 (H) 10/16/2018   INSULIN 10.3 06/12/2018   INSULIN 13.1 02/22/2018   Lab  Results  Component Value Date   HGBA1C 5.6 03/07/2019    Assessment/Plan:   Vitamin D deficiency  Low Vitamin D level contributes to fatigue and are associated with obesity, breast, and colon cancer. Ariel Ellis agrees to continue to take prescription Vitamin D @50 ,000 IU every week #4 with no refills and she will follow-up for routine testing of Vitamin D, at least 2-3 times per year to avoid over-replacement.  Insulin resistance Ariel Ellis will continue to work on weight loss, exercise, and decreasing simple carbohydrates to help decrease the risk of diabetes. Ariel Ellis refused metformin. She agreed to follow-up with [place] as directed to closely monitor her progress.  Obesity Ariel Ellis is currently in the action stage of change. As such, her goal is to continue with weight loss efforts. She has agreed to the Category 1 Plan +100 calories.   Ariel Ellis will add resistance exercise 2 days per week.  Behavioral modification strategies: increasing lean protein intake, decreasing simple carbohydrates and planning for success. Information regarding My[employer]itness[employer] was sent to patient via MyChart.  Ariel Ellis has agreed to follow-up with our clinic in 2 weeks. She was informed of the importance of frequent follow-up visits to maximize her success with intensive lifestyle modifications for her multiple health conditions.  Objective:   VITALS: Per patient if applicable, see vitals. GENERAL: Alert and in no acute distress. CARDIOPULMONARY: No increased WOB. Speaking in clear sentences.  PSYCH: Pleasant and cooperative. Speech normal rate and rhythm. Affect is appropriate. Insight and judgement are appropriate. Attention is focused, linear, and appropriate.  NEURO: Oriented as arrived to appointment on time with no prompting.   Lab Results  Component Value Date   CREATININE 0.77 06/12/2018   BUN 16 06/12/2018   NA 143 06/12/2018   K 4.4 06/12/2018   CL 107 (H) 06/12/2018   CO2 22 06/12/2018   Lab Results  Component Value Date    ALT 17 06/12/2018   AST 15 06/12/2018   ALKPHOS 96 06/12/2018   BILITOT 0.2 06/12/2018   Lab Results  Component Value Date   HGBA1C 5.6 03/07/2019   HGBA1C 5.8 (H) 10/16/2018   HGBA1C 5.9 (H) 06/12/2018   HGBA1C 5.8 (H) 02/22/2018   Lab Results  Component Value Date   INSULIN 26.3 (H) 10/16/2018   INSULIN 10.3 06/12/2018   INSULIN 13.1 02/22/2018   Lab Results  Component Value Date   TSH 1.340 03/07/2019   Lab Results  Component Value Date   CHOL 174 10/16/2018   HDL 54 10/16/2018   LDLCALC 101 (H) 10/16/2018   TRIG 93 10/16/2018   CHOLHDL 3.5 01/06/2018   Lab Results  Component Value Date   WBC 5.8 01/06/2018   HGB 13.5 01/06/2018   HCT 41.2 01/06/2018   MCV 86 01/06/2018   PLT 272 01/06/2018   No results found for: IRON, TIBC, FERRITIN   Ref. Range 03/07/2019 15:27  Vitamin D, 25-Hydroxy Latest Ref Range: 30.0 - 100.0 ng/mL 42.9   Attestation Statements:   Reviewed by clinician on day of visit: allergies, medications, problem list, medical history, surgical history, family history, social history, and previous encounter notes.  Cristi Loron, am acting as Energy manager for [place], FNP-C.  I have reviewed the above documentation for accuracy and completeness, and I agree with the above. - Gaspard Isbell [employer], FNP-C

## 2019-05-02 ENCOUNTER — Encounter (INDEPENDENT_AMBULATORY_CARE_PROVIDER_SITE_OTHER): Payer: Self-pay | Admitting: Family Medicine

## 2019-05-02 ENCOUNTER — Telehealth (INDEPENDENT_AMBULATORY_CARE_PROVIDER_SITE_OTHER): Payer: Managed Care, Other (non HMO) | Admitting: Family Medicine

## 2019-05-02 ENCOUNTER — Other Ambulatory Visit: Payer: Self-pay

## 2019-05-02 DIAGNOSIS — Z6830 Body mass index (BMI) 30.0-30.9, adult: Secondary | ICD-10-CM | POA: Diagnosis not present

## 2019-05-02 DIAGNOSIS — E8881 Metabolic syndrome: Secondary | ICD-10-CM

## 2019-05-02 DIAGNOSIS — E669 Obesity, unspecified: Secondary | ICD-10-CM

## 2019-05-02 NOTE — Progress Notes (Signed)
TeleHealth Visit:  Due to the COVID-19 pandemic, this visit was completed with telemedicine (audio/video) technology to reduce patient and provider exposure as well as to preserve personal protective equipment.   Ariel Ellis has verbally consented to this TeleHealth visit. The patient is located at work, the provider is located at the [facility] [facility] office. The participants in this visit include the listed provider and patient and any and all parties involved. The visit was conducted today via FaceTime.  Chief Complaint: OBESITY Ariel Ellis is here to discuss her progress with her obesity treatment plan along with follow-up of her obesity related diagnoses. Ariel Ellis is on the Category 1 Plan +100 calories and states she is following her eating plan approximately 90% of the time. Ariel Ellis states she is walking 50 minutes 5 times per week and doing squats, weight training and stretching for 25 minutes, 2 times per week.  Today's visit was #: 27 Starting weight: 217 lbs Starting date: 02/22/2018  Interim History: Ariel Ellis is a bit frustrated due to adhering to plan well without any weight loss. Her weight today is 180 lbs. Her goal weight is 171-171 lbs.   Subjective:   Insulin resistance Ariel Ellis has a diagnosis of insulin resistance based on her elevated fasting insulin level >5. She is not on metformin. Ariel Ellis denies polyphagia. She continues to work on diet and exercise to decrease her risk of diabetes.  Lab Results  Component Value Date   INSULIN 26.3 (H) 10/16/2018   INSULIN 10.3 06/12/2018   INSULIN 13.1 02/22/2018   Lab Results  Component Value Date   HGBA1C 5.6 03/07/2019   Assessment/Plan:   Insulin resistance Ariel Ellis will continue with the meal plan and she will continue to work on weight loss, exercise, and decreasing simple carbohydrates to help decrease the risk of diabetes. Ariel Ellis agreed to follow-up with [place] as directed to closely monitor her progress.  Class 1 obesity with serious  comorbidity and body mass index (BMI) of 30.0 to 30.9 in adult, unspecified obesity type Ariel Ellis is currently in the action stage of change. As such, her goal is to continue with weight loss efforts. She has agreed to the Category 1 Plan +100 calories or follow the lower carbohydrate, vegetable and lean protein rich diet plan.  We discussed the low carb plan and she would like to try it. The low carb plan was sent to patient via MyChart.  Exercise goals: Ariel Ellis will continue her current exercise regimen.  Behavioral modification strategies: increasing lean protein intake and planning for success.  Ariel Ellis has agreed to follow-up with our clinic in 2 weeks. She was informed of the importance of frequent follow-up visits to maximize her success with intensive lifestyle modifications for her multiple health conditions.  Objective:   VITALS: Per patient if applicable, see vitals. GENERAL: Alert and in no acute distress. CARDIOPULMONARY: No increased WOB. Speaking in clear sentences.  PSYCH: Pleasant and cooperative. Speech normal rate and rhythm. Affect is appropriate. Insight and judgement are appropriate. Attention is focused, linear, and appropriate.  NEURO: Oriented as arrived to appointment on time with no prompting.   Lab Results  Component Value Date   CREATININE 0.77 06/12/2018   BUN 16 06/12/2018   NA 143 06/12/2018   K 4.4 06/12/2018   CL 107 (H) 06/12/2018   CO2 22 06/12/2018   Lab Results  Component Value Date   ALT 17 06/12/2018   AST 15 06/12/2018   ALKPHOS 96 06/12/2018   BILITOT  0.2 06/12/2018   Lab Results  Component Value Date   HGBA1C 5.6 03/07/2019   HGBA1C 5.8 (H) 10/16/2018   HGBA1C 5.9 (H) 06/12/2018   HGBA1C 5.8 (H) 02/22/2018   Lab Results  Component Value Date   INSULIN 26.3 (H) 10/16/2018   INSULIN 10.3 06/12/2018   INSULIN 13.1 02/22/2018   Lab Results  Component Value Date   TSH 1.340 03/07/2019   Lab Results  Component Value Date   CHOL 174  10/16/2018   HDL 54 10/16/2018   LDLCALC 101 (H) 10/16/2018   TRIG 93 10/16/2018   CHOLHDL 3.5 01/06/2018   Lab Results  Component Value Date   WBC 5.8 01/06/2018   HGB 13.5 01/06/2018   HCT 41.2 01/06/2018   MCV 86 01/06/2018   PLT 272 01/06/2018   No results found for: IRON, TIBC, FERRITIN   Ref. Range 03/07/2019 15:27  Vitamin D, 25-Hydroxy Latest Ref Range: 30.0 - 100.0 ng/mL 42.9   Attestation Statements:   Reviewed by clinician on day of visit: allergies, medications, problem list, medical history, surgical history, family history, social history, and previous encounter notes.  Corey Skains, am acting as Location manager for [employer], FNP-C.  I have reviewed the above documentation for accuracy and completeness, and I agree with the above. - Britanee Vanblarcom [employer], FNP-C

## 2019-05-03 ENCOUNTER — Telehealth (INDEPENDENT_AMBULATORY_CARE_PROVIDER_SITE_OTHER): Payer: Managed Care, Other (non HMO) | Admitting: Family Medicine

## 2019-05-16 ENCOUNTER — Encounter (INDEPENDENT_AMBULATORY_CARE_PROVIDER_SITE_OTHER): Payer: Self-pay | Admitting: Family Medicine

## 2019-05-16 ENCOUNTER — Other Ambulatory Visit: Payer: Self-pay

## 2019-05-16 ENCOUNTER — Telehealth (INDEPENDENT_AMBULATORY_CARE_PROVIDER_SITE_OTHER): Payer: Managed Care, Other (non HMO) | Admitting: Family Medicine

## 2019-05-16 DIAGNOSIS — Z6830 Body mass index (BMI) 30.0-30.9, adult: Secondary | ICD-10-CM | POA: Diagnosis not present

## 2019-05-16 DIAGNOSIS — K5909 Other constipation: Secondary | ICD-10-CM | POA: Diagnosis not present

## 2019-05-16 DIAGNOSIS — E8881 Metabolic syndrome: Secondary | ICD-10-CM | POA: Diagnosis not present

## 2019-05-16 DIAGNOSIS — E669 Obesity, unspecified: Secondary | ICD-10-CM | POA: Diagnosis not present

## 2019-05-16 NOTE — Progress Notes (Signed)
TeleHealth Visit:  Due to the COVID-19 pandemic, this visit was completed with telemedicine (audio/video) technology to reduce patient and provider exposure as well as to preserve personal protective equipment.   [name] has verbally consented to this TeleHealth visit. The patient is located at home, the provider is located at the [facility] office. The participants in this visit include the listed provider and patient. The visit was conducted today via face time.   Chief Complaint: OBESITY [name] is here to discuss her progress with her obesity treatment plan along with follow-up of her obesity related diagnoses. Ariel Ellis is on the Category 1 Plan + 100 calories or following a lower carbohydrate, vegetable and lean protein rich diet plan and states she is following her eating plan approximately 95% of the time. Ariel Ellis states she is on the treadmill and lifting weights for 50 minutes 2-5 times per week.  Today's visit was #: 28 Starting weight: 217 lbs Starting date: 02/22/18  Interim History: Ariel Ellis reports she has maintained her weight at 180 lbs. We switched her to the Low Carbohydrate plan at her last in office visit. She is disappointed that she has not lost any weight despite sticking to low carb plan well. She did have a few desserts over the weekend. She has added squats, crunches, and weights 2 times per week.  Subjective:   1. Other constipation Ariel Ellis reports her BM's are less frequent since starting the low carbohydrate plan. She reports good water and vegetable intake. Her BMs are not hard or painful.  2. Insulin resistance Ariel Ellis denies polyphagia. We discussed starting metformin to decrease circulating insulin in her system to hopefully decrease fat storage. She declined metformin at this time.  Assessment/Plan:   1. Other constipation Ariel Ellis was informed that a decrease in bowel movement frequency is normal while losing weight, but stools should not be hard or painful. Ariel Ellis is to  take stool softener as needed. Orders and follow up as documented in patient record.   2. Insulin resistance Ariel Ellis will continue her meal plan, and will continue to work on weight loss, exercise, and decreasing simple carbohydrates to help decrease the risk of diabetes. Ariel Ellis agreed to follow-up with Ariel Ellis as directed to closely monitor her progress.  3. Class 1 obesity with serious comorbidity and body mass index (BMI) of 30.0 to 30.9 in adult, unspecified obesity type Ariel Ellis is currently in the action stage of change. As such, her goal is to continue with weight loss efforts. She has agreed to following a lower carbohydrate, vegetable and lean protein rich diet plan.   Exercise goals: Ariel Ellis is to continue her current exercise regimen as is.  Behavioral modification strategies: increasing vegetables.  Ariel Ellis has agreed to follow-up with our clinic in 2 weeks. She was informed of the importance of frequent follow-up visits to maximize her success with intensive lifestyle modifications for her multiple health conditions.  Objective:   VITALS: Per patient if applicable, see vitals. GENERAL: Alert and in no acute distress. CARDIOPULMONARY: No increased WOB. Speaking in clear sentences.  PSYCH: Pleasant and cooperative. Speech normal rate and rhythm. Affect is appropriate. Insight and judgement are appropriate. Attention is focused, linear, and appropriate.  NEURO: Oriented as arrived to appointment on time with no prompting.   Lab Results  Component Value Date   CREATININE 0.77 06/12/2018   BUN 16 06/12/2018   NA 143 06/12/2018   K 4.4 06/12/2018   CL 107 (H) 06/12/2018   CO2 22 06/12/2018  Lab Results  Component Value Date   ALT 17 06/12/2018   AST 15 06/12/2018   ALKPHOS 96 06/12/2018   BILITOT 0.2 06/12/2018   Lab Results  Component Value Date   HGBA1C 5.6 03/07/2019   HGBA1C 5.8 (H) 10/16/2018   HGBA1C 5.9 (H) 06/12/2018   HGBA1C 5.8 (H) 02/22/2018   Lab Results  Component Value Date    INSULIN 26.3 (H) 10/16/2018   INSULIN 10.3 06/12/2018   INSULIN 13.1 02/22/2018   Lab Results  Component Value Date   TSH 1.340 03/07/2019   Lab Results  Component Value Date   CHOL 174 10/16/2018   HDL 54 10/16/2018   LDLCALC 101 (H) 10/16/2018   TRIG 93 10/16/2018   CHOLHDL 3.5 01/06/2018   Lab Results  Component Value Date   WBC 5.8 01/06/2018   HGB 13.5 01/06/2018   HCT 41.2 01/06/2018   MCV 86 01/06/2018   PLT 272 01/06/2018   No results found for: IRON, TIBC, FERRITIN  Attestation Statements:   Reviewed by clinician on day of visit: allergies, medications, problem list, medical history, surgical history, family history, social history, and previous encounter notes.   Wilhemena Durie, am acting as Location manager for [employer], FNP-C.  I have reviewed the above documentation for accuracy and completeness, and I agree with the above. - Georgianne Fick, FNP

## 2019-05-30 ENCOUNTER — Other Ambulatory Visit: Payer: Self-pay

## 2019-05-30 ENCOUNTER — Encounter (INDEPENDENT_AMBULATORY_CARE_PROVIDER_SITE_OTHER): Payer: Self-pay | Admitting: Family Medicine

## 2019-05-30 ENCOUNTER — Telehealth (INDEPENDENT_AMBULATORY_CARE_PROVIDER_SITE_OTHER): Payer: Managed Care, Other (non HMO) | Admitting: Family Medicine

## 2019-05-30 DIAGNOSIS — E669 Obesity, unspecified: Secondary | ICD-10-CM

## 2019-05-30 DIAGNOSIS — Z6830 Body mass index (BMI) 30.0-30.9, adult: Secondary | ICD-10-CM | POA: Diagnosis not present

## 2019-05-30 DIAGNOSIS — K5909 Other constipation: Secondary | ICD-10-CM

## 2019-05-30 DIAGNOSIS — E559 Vitamin D deficiency, unspecified: Secondary | ICD-10-CM

## 2019-05-31 MED ORDER — VITAMIN D (ERGOCALCIFEROL) 1.25 MG (50000 UNIT) PO CAPS: 50000.0000 [IU] | ORAL_CAPSULE | ORAL | 0 refills | Status: DC

## 2019-05-31 NOTE — Progress Notes (Signed)
TeleHealth Visit:  Due to the COVID-19 pandemic, this visit was completed with telemedicine (audio/video) technology to reduce patient and provider exposure as well as to preserve personal protective equipment.   Ariel Ellis has verbally consented to this TeleHealth visit. The patient is located at home, the provider is located at the [facility] office. The participants in this visit include the listed provider and patient. The visit was conducted today via face time.   Chief Complaint: OBESITY Patches is here to discuss her progress with her obesity treatment plan along with follow-up of her obesity related diagnoses. Ariel Ellis is on following a lower carbohydrate, vegetable and lean protein rich diet plan and states she is following her eating plan approximately 95% of the time. Ariel Ellis states she is on the treadmill for 50 minutes 5 times per week, and weight training 2 times per week.  Today's visit was #: 29 Starting weight: 217 lbs Starting date: 02/22/18  Interim History: Ariel Ellis reports losing 2 lbs since her last virtual visit, and her weight is 178 lbs today. She finds the Low carbohydrate plan to be fairly easy to adhere to. She is drinking plenty of water and she is eating at least 2 cups of vegetables per day.  Subjective:   1. Vitamin D deficiency Ariel Ellis's Vit D level is nearly at goal (42.9 on 03/07/2019). She is on prescription Vit D weekly.  2. Other constipation Ariel Ellis states her constipation has improved. It has resolved on it's own. She is not taking any medications for this.  Assessment/Plan:   1. Vitamin D deficiency Low Vitamin D level contributes to fatigue and are associated with obesity, breast, and colon cancer. We will refill prescription Vitamin D for 1 month. Ariel Ellis will follow-up for routine testing of Vitamin D, at least 2-3 times per year to avoid over-replacement.  - Vitamin D, Ergocalciferol, (DRISDOL) 1.25 MG (50000 UNIT) CAPS capsule; Take 1 capsule (50,000 Units  total) by mouth every 7 (seven) days.  Dispense: 4 capsule; Refill: 0  2. Other constipation We will continue to monitor.   3. Class 1 obesity with serious comorbidity and body mass index (BMI) of 30.0 to 30.9 in adult, unspecified obesity type Ariel Ellis is currently in the action stage of change. As such, her goal is to continue with weight loss efforts. She has agreed to following a lower carbohydrate, vegetable and lean protein rich diet plan.   Exercise goals: Ariel Ellis is to continue her current exercise regimen.  Behavioral modification strategies: planning for success.  Ariel Ellis has agreed to follow-up with our clinic in 3 weeks. She was informed of the importance of frequent follow-up visits to maximize her success with intensive lifestyle modifications for her multiple health conditions.  Objective:   VITALS: Per patient if applicable, see vitals. GENERAL: Alert and in no acute distress. CARDIOPULMONARY: No increased WOB. Speaking in clear sentences.  PSYCH: Pleasant and cooperative. Speech normal rate and rhythm. Affect is appropriate. Insight and judgement are appropriate. Attention is focused, linear, and appropriate.  NEURO: Oriented as arrived to appointment on time with no prompting.   Lab Results  Component Value Date   CREATININE 0.77 06/12/2018   BUN 16 06/12/2018   NA 143 06/12/2018   K 4.4 06/12/2018   CL 107 (H) 06/12/2018   CO2 22 06/12/2018   Lab Results  Component Value Date   ALT 17 06/12/2018   AST 15 06/12/2018   ALKPHOS 96 06/12/2018   BILITOT 0.2 06/12/2018   Lab  Results  Component Value Date   HGBA1C 5.6 03/07/2019   HGBA1C 5.8 (H) 10/16/2018   HGBA1C 5.9 (H) 06/12/2018   HGBA1C 5.8 (H) 02/22/2018   Lab Results  Component Value Date   INSULIN 26.3 (H) 10/16/2018   INSULIN 10.3 06/12/2018   INSULIN 13.1 02/22/2018   Lab Results  Component Value Date   TSH 1.340 03/07/2019   Lab Results  Component Value Date   CHOL 174 10/16/2018   HDL 54  10/16/2018   LDLCALC 101 (H) 10/16/2018   TRIG 93 10/16/2018   CHOLHDL 3.5 01/06/2018   Lab Results  Component Value Date   WBC 5.8 01/06/2018   HGB 13.5 01/06/2018   HCT 41.2 01/06/2018   MCV 86 01/06/2018   PLT 272 01/06/2018   No results found for: IRON, TIBC, FERRITIN  Attestation Statements:   Reviewed by clinician on day of visit: allergies, medications, problem list, medical history, surgical history, family history, social history, and previous encounter notes.   Wilhemena Durie, am acting as Location manager for [employer], FNP-C.  I have reviewed the above documentation for accuracy and completeness, and I agree with the above. - Georgianne Fick, FNP

## 2019-06-14 ENCOUNTER — Encounter (INDEPENDENT_AMBULATORY_CARE_PROVIDER_SITE_OTHER): Payer: Self-pay | Admitting: Family Medicine

## 2019-06-14 ENCOUNTER — Other Ambulatory Visit: Payer: Self-pay

## 2019-06-14 ENCOUNTER — Telehealth (INDEPENDENT_AMBULATORY_CARE_PROVIDER_SITE_OTHER): Payer: 59 | Admitting: Family Medicine

## 2019-06-14 DIAGNOSIS — E559 Vitamin D deficiency, unspecified: Secondary | ICD-10-CM | POA: Diagnosis not present

## 2019-06-14 DIAGNOSIS — Z6830 Body mass index (BMI) 30.0-30.9, adult: Secondary | ICD-10-CM

## 2019-06-14 DIAGNOSIS — E669 Obesity, unspecified: Secondary | ICD-10-CM

## 2019-06-14 NOTE — Progress Notes (Signed)
TeleHealth Visit:  Due to the COVID-19 pandemic, this visit was completed with telemedicine (audio/video) technology to reduce patient and provider exposure as well as to preserve personal protective equipment.   Ariel Ellis has verbally consented to this TeleHealth visit. The patient is located at work, the provider is located at the [facility] office. The participants in this visit include the listed provider and patient. The visit was conducted today via FaceTime.  Chief Complaint: OBESITY Ariel Ellis is here to discuss her progress with her obesity treatment plan along with follow-up of her obesity related diagnoses. Ariel Ellis is following a lower carbohydrate, vegetable and lean protein rich diet plan and states she is following her eating plan approximately 95% of the time. Ariel Ellis states she is walking 50 minutes 5 days a week and is doing squats 2 times per week.   Today's visit was #: 30 Starting weight: 217 lbs Starting date: 02/22/2018  Interim History: Ariel Ellis has lost 2 lbs and is down to 176 lbs. Hunger is satisfied. She finds the low carb plan easy to adhere to and it has jump started her weight loss again.. She denies constipation. She feels well on the plan.  Subjective:   Vitamin D deficiency. Last Vitamin D level not at goal - 42.9 on 03/07/2019. [name] is on prescription Vitamin D daily.  Assessment/Plan:   Vitamin D deficiency. Low Vitamin D level contributes to fatigue and are associated with obesity, breast, and colon cancer. She agrees to continue to take prescription Vitamin D and will follow-up for routine testing of Vitamin D, at least 2-3 times per year to avoid over-replacement.  Class 1 obesity with serious comorbidity and body mass index (BMI) of 30.0 to 30.9 in adult, unspecified obesity type - BMI greater than 30 at start of program.   Ariel Ellis is currently in the action stage of change. As such, her goal is to continue with weight loss efforts. She has agreed to  following a lower carbohydrate, vegetable and lean protein rich diet plan. She may have 1 tbsp peanut butter a day.  Exercise goals: Ariel Ellis will continue her current exercise regimen.  Behavioral modification strategies: decreasing simple carbohydrates and better snacking choices.  Ariel Ellis has agreed to follow-up with our clinic in 2 weeks. She was informed of the importance of frequent follow-up visits to maximize her success with intensive lifestyle modifications for her multiple health conditions.  Objective:   VITALS: Per patient if applicable, see vitals. GENERAL: Alert and in no acute distress. CARDIOPULMONARY: No increased WOB. Speaking in clear sentences.  PSYCH: Pleasant and cooperative. Speech normal rate and rhythm. Affect is appropriate. Insight and judgement are appropriate. Attention is focused, linear, and appropriate.  NEURO: Oriented as arrived to appointment on time with no prompting.   Lab Results  Component Value Date   CREATININE 0.77 06/12/2018   BUN 16 06/12/2018   NA 143 06/12/2018   K 4.4 06/12/2018   CL 107 (H) 06/12/2018   CO2 22 06/12/2018   Lab Results  Component Value Date   ALT 17 06/12/2018   AST 15 06/12/2018   ALKPHOS 96 06/12/2018   BILITOT 0.2 06/12/2018   Lab Results  Component Value Date   HGBA1C 5.6 03/07/2019   HGBA1C 5.8 (H) 10/16/2018   HGBA1C 5.9 (H) 06/12/2018   HGBA1C 5.8 (H) 02/22/2018   Lab Results  Component Value Date   INSULIN 26.3 (H) 10/16/2018   INSULIN 10.3 06/12/2018   INSULIN 13.1 02/22/2018  Lab Results  Component Value Date   TSH 1.340 03/07/2019   Lab Results  Component Value Date   CHOL 174 10/16/2018   HDL 54 10/16/2018   LDLCALC 101 (H) 10/16/2018   TRIG 93 10/16/2018   CHOLHDL 3.5 01/06/2018   Lab Results  Component Value Date   WBC 5.8 01/06/2018   HGB 13.5 01/06/2018   HCT 41.2 01/06/2018   MCV 86 01/06/2018   PLT 272 01/06/2018   No results found for: IRON, TIBC, FERRITIN  Attestation  Statements:   Reviewed by clinician on day of visit: allergies, medications, problem list, medical history, surgical history, family history, social history, and previous encounter notes.  IMarianna Payment, am acting as Energy manager for [employer], FNP   I have reviewed the above documentation for accuracy and completeness, and I agree with the above. - Jesse Sans, FNP

## 2019-06-28 ENCOUNTER — Telehealth (INDEPENDENT_AMBULATORY_CARE_PROVIDER_SITE_OTHER): Payer: 59 | Admitting: Family Medicine

## 2019-06-28 ENCOUNTER — Other Ambulatory Visit: Payer: Self-pay

## 2019-06-28 ENCOUNTER — Encounter (INDEPENDENT_AMBULATORY_CARE_PROVIDER_SITE_OTHER): Payer: Self-pay | Admitting: Family Medicine

## 2019-06-28 DIAGNOSIS — Z6830 Body mass index (BMI) 30.0-30.9, adult: Secondary | ICD-10-CM

## 2019-06-28 DIAGNOSIS — E8881 Metabolic syndrome: Secondary | ICD-10-CM | POA: Diagnosis not present

## 2019-06-28 DIAGNOSIS — E669 Obesity, unspecified: Secondary | ICD-10-CM

## 2019-07-02 NOTE — Progress Notes (Signed)
TeleHealth Visit:  Due to the COVID-19 pandemic, this visit was completed with telemedicine (audio/video) technology to reduce patient and provider exposure as well as to preserve personal protective equipment.   Ariel Ellis has verbally consented to this TeleHealth visit. The patient is located at home, the provider is located at the [facility] office. The participants in this visit include the listed provider and patient. The visit was conducted today via face time.   Chief Complaint: OBESITY Ariel Ellis is here to discuss her progress with her obesity treatment plan along with follow-up of her obesity related diagnoses. Ariel Ellis is on following a lower carbohydrate, vegetable and lean protein rich diet plan and states she is following her eating plan approximately 95% of the time. Ariel Ellis states she is walking on the treadmill for 50 minutes 5 times per week.  Today's visit was #: 31 Starting weight: 217 lbs Starting date: 02/22/2018  Interim History: Ariel Ellis is doing quite well on the Low carbohydrate plan. She had plateaued on the regular category plan and the low carb has stimulated weight loss. She does miss eating fruit. She would like to have fresh berries when they come in season in May. She has lost 2 lbs and reports weight to be 174.8 lbs today.  Subjective:   1. Insulin resistance Ariel Ellis denies polyphagia, and she is not on metformin. She notes cravings for fruit at times due to being on the low carbohydrate plan.  Assessment/Plan:   1. Insulin resistance Ariel Ellis will continue her meal plan, and will continue to work on weight loss, exercise, and decreasing simple carbohydrates to help decrease the risk of diabetes. Ariel Ellis agreed to follow-up with [place] as directed to closely monitor her progress.  2. Class 1 obesity with serious comorbidity and body mass index (BMI) of 30.0 to 30.9 in adult, unspecified obesity type Ariel Ellis is currently in the action stage of change. As such, her goal is to continue  with weight loss efforts. She has agreed to following a lower carbohydrate, vegetable and lean protein rich diet plan.   Exercise goals: As is.  Behavioral modification strategies: decreasing simple carbohydrates and planning for success.  Ariel Ellis has agreed to follow-up with our clinic in 3 weeks. She was informed of the importance of frequent follow-up visits to maximize her success with intensive lifestyle modifications for her multiple health conditions.  Objective:   VITALS: Per patient if applicable, see vitals. GENERAL: Alert and in no acute distress. CARDIOPULMONARY: No increased WOB. Speaking in clear sentences.  PSYCH: Pleasant and cooperative. Speech normal rate and rhythm. Affect is appropriate. Insight and judgement are appropriate. Attention is focused, linear, and appropriate.  NEURO: Oriented as arrived to appointment on time with no prompting.   Lab Results  Component Value Date   CREATININE 0.77 06/12/2018   BUN 16 06/12/2018   NA 143 06/12/2018   K 4.4 06/12/2018   CL 107 (H) 06/12/2018   CO2 22 06/12/2018   Lab Results  Component Value Date   ALT 17 06/12/2018   AST 15 06/12/2018   ALKPHOS 96 06/12/2018   BILITOT 0.2 06/12/2018   Lab Results  Component Value Date   HGBA1C 5.6 03/07/2019   HGBA1C 5.8 (H) 10/16/2018   HGBA1C 5.9 (H) 06/12/2018   HGBA1C 5.8 (H) 02/22/2018   Lab Results  Component Value Date   INSULIN 26.3 (H) 10/16/2018   INSULIN 10.3 06/12/2018   INSULIN 13.1 02/22/2018   Lab Results  Component Value Date  TSH 1.340 03/07/2019   Lab Results  Component Value Date   CHOL 174 10/16/2018   HDL 54 10/16/2018   LDLCALC 101 (H) 10/16/2018   TRIG 93 10/16/2018   CHOLHDL 3.5 01/06/2018   Lab Results  Component Value Date   WBC 5.8 01/06/2018   HGB 13.5 01/06/2018   HCT 41.2 01/06/2018   MCV 86 01/06/2018   PLT 272 01/06/2018   No results found for: IRON, TIBC, FERRITIN  Attestation Statements:   Reviewed by clinician on day  of visit: allergies, medications, problem list, medical history, surgical history, family history, social history, and previous encounter notes.   Trude Mcburney, am acting as Energy manager for [employer], FNP-C.  I have reviewed the above documentation for accuracy and completeness, and I agree with the above. - Jesse Sans, FNP

## 2019-07-16 ENCOUNTER — Encounter (INDEPENDENT_AMBULATORY_CARE_PROVIDER_SITE_OTHER): Payer: Self-pay | Admitting: Family Medicine

## 2019-07-16 ENCOUNTER — Other Ambulatory Visit: Payer: Self-pay

## 2019-07-16 ENCOUNTER — Ambulatory Visit (INDEPENDENT_AMBULATORY_CARE_PROVIDER_SITE_OTHER): Payer: 59 | Admitting: Family Medicine

## 2019-07-16 VITALS — BP 105/70 | HR 60 | Temp 97.9°F | Ht 65.0 in | Wt 178.0 lb

## 2019-07-16 DIAGNOSIS — E7849 Other hyperlipidemia: Secondary | ICD-10-CM | POA: Insufficient documentation

## 2019-07-16 DIAGNOSIS — E669 Obesity, unspecified: Secondary | ICD-10-CM | POA: Diagnosis not present

## 2019-07-16 DIAGNOSIS — E8881 Metabolic syndrome: Secondary | ICD-10-CM

## 2019-07-16 DIAGNOSIS — E559 Vitamin D deficiency, unspecified: Secondary | ICD-10-CM | POA: Diagnosis not present

## 2019-07-16 DIAGNOSIS — Z6830 Body mass index (BMI) 30.0-30.9, adult: Secondary | ICD-10-CM

## 2019-07-16 NOTE — Progress Notes (Signed)
Chief Complaint:   OBESITY Ariel Ellis is here to discuss her progress with her obesity treatment plan along with follow-up of her obesity related diagnoses. [name] is on following a lower carbohydrate, vegetable and lean protein rich diet plan and states she is following her eating plan approximately 95% of the time. [name] states she is walking for 5 days per week and doing squats and weights for 50-60 minutes 2-3 times per week.  Today's visit was #: 5 Starting weight: 217 lbs Starting date: 02/22/2018 Today's weight: 178 lbs Today's date: 07/16/2019 Total lbs lost to date: 39 lbs Total lbs lost since last in-office visit: 2 lbs  Interim History: Ariel Ellis is still happy following the low carb plan and denies hunger.    Her vegetable and water intake is adequate..    Subjective:   1. Vitamin D deficiency Nearly at goal.  She is taking prescription vitamin D. She does not always remember to take it.  2. Insulin resistance She is taking metformin.  She denies polyphagia.   Lab Results  Component Value Date   INSULIN 26.3 (H) 10/16/2018   INSULIN 10.3 06/12/2018   INSULIN 13.1 02/22/2018   Lab Results  Component Value Date   HGBA1C 5.6 03/07/2019   3. Other hyperlipidemia Baili's last LDL was barely elevated at 101.  HDL and triglycerides were within normal limits.  Lab Results  Component Value Date   ALT 17 06/12/2018   AST 15 06/12/2018   ALKPHOS 96 06/12/2018   BILITOT 0.2 06/12/2018   Lab Results  Component Value Date   CHOL 174 10/16/2018   HDL 54 10/16/2018   LDLCALC 101 (H) 10/16/2018   TRIG 93 10/16/2018   CHOLHDL 3.5 01/06/2018   Assessment/Plan:   1. Vitamin D deficiency Low Vitamin D level contributes to fatigue and are associated with obesity, breast, and colon cancer. She agrees to continue to take prescription Vitamin D @50 ,000 IU every week and will follow-up for routine testing of Vitamin D, at least 2-3 times per year to avoid over-replacement.   -  Comprehensive metabolic panel - VITAMIN D 25 Hydroxy (Vit-D Deficiency, Fractures)  2. Insulin resistance Ariel Ellis will continue to work on weight loss, exercise, and decreasing simple carbohydrates to help decrease the risk of diabetes. Ariel Ellis agreed to follow-up with [place] as directed to closely monitor her progress. - Comprehensive metabolic panel - Hemoglobin A1c - Insulin, random  3. Other hyperlipidemia Cardiovascular risk and specific lipid/LDL goals reviewed.  We discussed several lifestyle modifications today and Ariel Ellis will continue to work on diet, exercise and weight loss efforts.   4. Class 1 obesity with serious comorbidity and body mass index (BMI) of 30.0 to 30.9 in adult, unspecified obesity type Loanne is currently in the action stage of change. As such, her goal is to continue with weight loss efforts. She has agreed to following a lower carbohydrate, vegetable and lean protein rich diet plan.   Exercise goals: As is.  Behavioral modification strategies: meal planning and cooking strategies and planning for success.  Ariel Ellis has agreed to follow-up with our clinic in 4 weeks. She was informed of the importance of frequent follow-up visits to maximize her success with intensive lifestyle modifications for her multiple health conditions.   Ariel Ellis was informed we would discuss her lab results at her next visit unless there is a critical issue that needs to be addressed sooner. Young agreed to keep her next visit at the agreed upon time to discuss these  results.  Objective:   Blood pressure 105/70, pulse 60, temperature 97.9 F (36.6 C), temperature source Oral, height 5\' 5"  (1.651 m), weight 178 lb (80.7 kg), last menstrual period 04/05/2013, SpO2 98 %. Body mass index is 29.62 kg/m.  General: Cooperative, alert, well developed, in no acute distress. HEENT: Conjunctivae and lids unremarkable. Cardiovascular: Regular rhythm.  Lungs: Normal work of breathing. Neurologic: No focal deficits.    Lab Results  Component Value Date   CREATININE 0.77 06/12/2018   BUN 16 06/12/2018   NA 143 06/12/2018   K 4.4 06/12/2018   CL 107 (H) 06/12/2018   CO2 22 06/12/2018   Lab Results  Component Value Date   ALT 17 06/12/2018   AST 15 06/12/2018   ALKPHOS 96 06/12/2018   BILITOT 0.2 06/12/2018   Lab Results  Component Value Date   HGBA1C 5.6 03/07/2019   HGBA1C 5.8 (H) 10/16/2018   HGBA1C 5.9 (H) 06/12/2018   HGBA1C 5.8 (H) 02/22/2018   Lab Results  Component Value Date   INSULIN 26.3 (H) 10/16/2018   INSULIN 10.3 06/12/2018   INSULIN 13.1 02/22/2018   Lab Results  Component Value Date   TSH 1.340 03/07/2019   Lab Results  Component Value Date   CHOL 174 10/16/2018   HDL 54 10/16/2018   LDLCALC 101 (H) 10/16/2018   TRIG 93 10/16/2018   CHOLHDL 3.5 01/06/2018   Lab Results  Component Value Date   WBC 5.8 01/06/2018   HGB 13.5 01/06/2018   HCT 41.2 01/06/2018   MCV 86 01/06/2018   PLT 272 01/06/2018   Attestation Statements:   Reviewed by clinician on day of visit: allergies, medications, problem list, medical history, surgical history, family history, social history, and previous encounter notes.  I, 03/08/2018, CMA, am acting as Insurance claims handler for Energy manager, FNP-C.  I have reviewed the above documentation for accuracy and completeness, and I agree with the above. -  [place], FNP

## 2019-07-17 LAB — COMPREHENSIVE METABOLIC PANEL
ALT: 15 IU/L (ref 0–32)
AST: 13 IU/L (ref 0–40)
Albumin/Globulin Ratio: 2 (ref 1.2–2.2)
Albumin: 4.3 g/dL (ref 3.8–4.9)
Alkaline Phosphatase: 100 IU/L (ref 39–117)
BUN/Creatinine Ratio: 17 (ref 9–23)
BUN: 13 mg/dL (ref 6–24)
Bilirubin Total: 0.3 mg/dL (ref 0.0–1.2)
CO2: 24 mmol/L (ref 20–29)
Calcium: 9.2 mg/dL (ref 8.7–10.2)
Chloride: 107 mmol/L — ABNORMAL HIGH (ref 96–106)
Creatinine, Ser: 0.76 mg/dL (ref 0.57–1.00)
GFR calc Af Amer: 104 mL/min/{1.73_m2} (ref >59)
GFR calc non Af Amer: 90 mL/min/{1.73_m2} (ref >59)
Globulin, Total: 2.1 g/dL (ref 1.5–4.5)
Glucose: 87 mg/dL (ref 65–99)
Potassium: 5.1 mmol/L (ref 3.5–5.2)
Sodium: 145 mmol/L — ABNORMAL HIGH (ref 134–144)
Total Protein: 6.4 g/dL (ref 6.0–8.5)

## 2019-07-17 LAB — LIPID PANEL WITH LDL/HDL RATIO
Cholesterol, Total: 200 mg/dL — ABNORMAL HIGH (ref 100–199)
HDL: 72 mg/dL (ref >39)
LDL Chol Calc (NIH): 115 mg/dL — ABNORMAL HIGH (ref 0–99)
LDL/HDL Ratio: 1.6 ratio (ref 0.0–3.2)
Triglycerides: 74 mg/dL (ref 0–149)
VLDL Cholesterol Cal: 13 mg/dL (ref 5–40)

## 2019-07-17 LAB — VITAMIN D 25 HYDROXY (VIT D DEFICIENCY, FRACTURES): Vit D, 25-Hydroxy: 47 ng/mL (ref 30.0–100.0)

## 2019-07-17 LAB — HEMOGLOBIN A1C
Est. average glucose Bld gHb Est-mCnc: 114 mg/dL
Hgb A1c MFr Bld: 5.6 % (ref 4.8–5.6)

## 2019-07-17 LAB — INSULIN, RANDOM: INSULIN: 4.3 u[IU]/mL (ref 2.6–24.9)

## 2019-08-13 ENCOUNTER — Ambulatory Visit (INDEPENDENT_AMBULATORY_CARE_PROVIDER_SITE_OTHER): Payer: 59 | Admitting: Family Medicine

## 2019-08-13 ENCOUNTER — Encounter (INDEPENDENT_AMBULATORY_CARE_PROVIDER_SITE_OTHER): Payer: Self-pay | Admitting: Family Medicine

## 2019-08-13 ENCOUNTER — Other Ambulatory Visit: Payer: Self-pay

## 2019-08-13 VITALS — BP 106/69 | HR 75 | Temp 98.2°F | Ht 65.0 in | Wt 171.0 lb

## 2019-08-13 DIAGNOSIS — E559 Vitamin D deficiency, unspecified: Secondary | ICD-10-CM | POA: Diagnosis not present

## 2019-08-13 DIAGNOSIS — Z9189 Other specified personal risk factors, not elsewhere classified: Secondary | ICD-10-CM | POA: Diagnosis not present

## 2019-08-13 DIAGNOSIS — E669 Obesity, unspecified: Secondary | ICD-10-CM

## 2019-08-13 DIAGNOSIS — E7849 Other hyperlipidemia: Secondary | ICD-10-CM | POA: Diagnosis not present

## 2019-08-13 DIAGNOSIS — Z6830 Body mass index (BMI) 30.0-30.9, adult: Secondary | ICD-10-CM

## 2019-08-13 MED ORDER — VITAMIN D (ERGOCALCIFEROL) 1.25 MG (50000 UNIT) PO CAPS: 50000.0000 [IU] | ORAL_CAPSULE | ORAL | 0 refills | Status: DC

## 2019-08-13 NOTE — Progress Notes (Signed)
Chief Complaint:   OBESITY Ariel Ellis is here to discuss her progress with her obesity treatment plan along with follow-up of her obesity related diagnoses. Ariel Ellis is on following a lower carbohydrate, vegetable and lean protein rich diet plan and states she is following her eating plan approximately 95% of the time. Ariel Ellis states she is on the treadmill, doing squats, and weights for 60 minutes 2-5 times per week.  Today's visit was #: 33 Starting weight: 217 lbs Starting date: 02/22/2018 Today's weight: 171 lbs Today's date: 08/13/2019 Total lbs lost to date: 46 Total lbs lost since last in-office visit: 7  Interim History: Ariel Ellis is now at her weight goal of around 170 but now feels she would like to lose 5-10 more lbs and lose down to 160-165 lbs. She has added back in strawberries (1-2 cups per day) while on the low carb plan and loss is still steady.  She is happy with the low carbohydrate plan. She worries about that if she starts traveling again for work and working extra hours that it will be difficult for her to stick to the plan and continue exercising. She has been very consistent with exercise and she feels this is integral to her success.   Subjective:   1. Other hyperlipidemia Adelee's last LDL was elevated at 115, and HDL is at goal and has increased by 18 points. Her triglycerides were within normal limits. Her ASCVD risk score is of 0.9%. She is not on statin. The 10-year ASCVD risk score Ariel Ellis DC Montez Hageman., et al., 2013) is: 0.9%   Values used to calculate the score:     Age: 7 years     Sex: Female     Is Non-Hispanic African American: No     Diabetic: No     Tobacco smoker: No     Systolic Blood Pressure: 106 mmHg     Is BP treated: No     HDL Cholesterol: 72 mg/dL     Total Cholesterol: 200 mg/dL  2. Vitamin D deficiency Ariel Ellis's Vit D level is maintained at 47. She is on weekly high dose Vit D.  3. At risk for heart disease Ariel Ellis is at a higher than average risk for cardiovascular  disease due to obesity and insulin reistsance.   Assessment/Plan:   1. Other hyperlipidemia Cardiovascular risk and specific lipid/LDL goals reviewed. We discussed several lifestyle modifications today. [name] will continue her meal plan, and will continue to work on exercise and weight loss efforts. No statin is indicated.   2. Vitamin D deficiency Low Vitamin D level contributes to fatigue and are associated with obesity, breast, and colon cancer. We will refill prescription Vitamin D for 1 month. Ariel Ellis will follow-up for routine testing of Vitamin D, at least 2-3 times per year to avoid over-replacement.  - Vitamin D, Ergocalciferol, (DRISDOL) 1.25 MG (50000 UNIT) CAPS capsule; Take 1 capsule (50,000 Units total) by mouth every 7 (seven) days.  Dispense: 4 capsule; Refill: 0  3. At risk for heart disease Ariel Ellis was given approximately 15 minutes of coronary artery disease prevention counseling today. She is 54 y.o. female and has risk factors for heart disease including obesity. We discussed intensive lifestyle modifications today with an emphasis on specific weight loss instructions and strategies.   Repetitive spaced learning was employed today to elicit superior memory formation and behavioral change.  4. Class 1 obesity with serious comorbidity and body mass index (BMI) of 30.0 to 30.9 in adult, unspecified obesity  type Ariel Ellis is currently in the action stage of change. As such, her goal is to continue with weight loss efforts. She has agreed to following a lower carbohydrate, vegetable and lean protein rich diet plan.   Exercise goals: As is.  Behavioral modification strategies: increasing lean protein intake, decreasing simple carbohydrates and planning for success.  [name] has agreed to follow-up with our clinic in 4 weeks. She was informed of the importance of frequent follow-up visits to maximize her success with intensive lifestyle modifications for her multiple health conditions.    Objective:   Blood pressure 106/69, pulse 75, temperature 98.2 F (36.8 C), temperature source Oral, height 5\' 5"  (1.651 m), weight 171 lb (77.6 kg), last menstrual period 04/05/2013, SpO2 97 %. Body mass index is 28.46 kg/m.  General: Cooperative, alert, well developed, in no acute distress. HEENT: Conjunctivae and lids unremarkable. Cardiovascular: Regular rhythm.  Lungs: Normal work of breathing. Neurologic: No focal deficits.   Lab Results  Component Value Date   CREATININE 0.76 07/16/2019   BUN 13 07/16/2019   NA 145 (H) 07/16/2019   K 5.1 07/16/2019   CL 107 (H) 07/16/2019   CO2 24 07/16/2019   Lab Results  Component Value Date   ALT 15 07/16/2019   AST 13 07/16/2019   ALKPHOS 100 07/16/2019   BILITOT 0.3 07/16/2019   Lab Results  Component Value Date   HGBA1C 5.6 07/16/2019   HGBA1C 5.6 03/07/2019   HGBA1C 5.8 (H) 10/16/2018   HGBA1C 5.9 (H) 06/12/2018   HGBA1C 5.8 (H) 02/22/2018   Lab Results  Component Value Date   INSULIN 4.3 07/16/2019   INSULIN 26.3 (H) 10/16/2018   INSULIN 10.3 06/12/2018   INSULIN 13.1 02/22/2018   Lab Results  Component Value Date   TSH 1.340 03/07/2019   Lab Results  Component Value Date   CHOL 200 (H) 07/16/2019   HDL 72 07/16/2019   LDLCALC 115 (H) 07/16/2019   TRIG 74 07/16/2019   CHOLHDL 3.5 01/06/2018   Lab Results  Component Value Date   WBC 5.8 01/06/2018   HGB 13.5 01/06/2018   HCT 41.2 01/06/2018   MCV 86 01/06/2018   PLT 272 01/06/2018   No results found for: IRON, TIBC, FERRITIN  Attestation Statements:   Reviewed by clinician on day of visit: allergies, medications, problem list, medical history, surgical history, family history, social history, and previous encounter notes.   Wilhemena Durie, am acting as Location manager for [employer], FNP-C.  I have reviewed the above documentation for accuracy and completeness, and I agree with the above. -  Georgianne Fick, FNP

## 2019-08-13 NOTE — Progress Notes (Signed)
The 10-year ASCVD risk score Denman George DC Montez Hageman., et al., 2013) is: 0.9%   Values used to calculate the score:     Age: 54 years     Sex: Female     Is Non-Hispanic African American: No     Diabetic: No     Tobacco smoker: No     Systolic Blood Pressure: 106 mmHg     Is BP treated: No     HDL Cholesterol: 72 mg/dL     Total Cholesterol: 200 mg/dL

## 2019-08-29 ENCOUNTER — Encounter (INDEPENDENT_AMBULATORY_CARE_PROVIDER_SITE_OTHER): Payer: Self-pay

## 2019-09-11 ENCOUNTER — Ambulatory Visit (INDEPENDENT_AMBULATORY_CARE_PROVIDER_SITE_OTHER): Payer: 59 | Admitting: Adult Health

## 2019-09-11 ENCOUNTER — Other Ambulatory Visit: Payer: Self-pay

## 2019-09-11 ENCOUNTER — Encounter (INDEPENDENT_AMBULATORY_CARE_PROVIDER_SITE_OTHER): Payer: Self-pay | Admitting: Adult Health

## 2019-09-11 VITALS — BP 136/73 | HR 71 | Temp 98.1°F | Ht 65.0 in | Wt 169.0 lb

## 2019-09-11 DIAGNOSIS — E7849 Other hyperlipidemia: Secondary | ICD-10-CM | POA: Diagnosis not present

## 2019-09-11 DIAGNOSIS — Z6830 Body mass index (BMI) 30.0-30.9, adult: Secondary | ICD-10-CM | POA: Diagnosis not present

## 2019-09-11 DIAGNOSIS — E559 Vitamin D deficiency, unspecified: Secondary | ICD-10-CM | POA: Diagnosis not present

## 2019-09-11 DIAGNOSIS — E669 Obesity, unspecified: Secondary | ICD-10-CM | POA: Diagnosis not present

## 2019-09-11 NOTE — Progress Notes (Signed)
Chief Complaint:   OBESITY Ariel Ellis is here to discuss her progress with her obesity treatment plan along with follow-up of her obesity related diagnoses. Ariel Ellis is on following a lower carbohydrate, vegetable and lean protein rich diet plan and states she is following her eating plan approximately 95% of the time. Ariel Ellis states she is doing cardio 3 miles and strengthening 5 times per week.  Today's visit was #: 34 Starting weight: 217 lbs Starting date: 02/22/2018 Today's weight: 169 lbs Today's date: 09/11/2019 Total lbs lost to date: 48 Total lbs lost since last in-office visit: 2  Interim History: Ariel Ellis's breakfast is typically eggs, lunch is often homemade minestrone soup, and dinner will be a grilled protein with plenty of vegetables.  Subjective:   1. Other hyperlipidemia Felipe's lipid panel on 07/16/2019, showed a total cholesterol of 200, triglycerides 74, HDL 72, and LDL 115. She is not on statin therapy.  2. Vitamin D deficiency Ariel Ellis's Vit D level on 07/16/2019 was 47. She is on prescription strength Vit D supplementation, and is tolerating it well.  Assessment/Plan:   1. Other hyperlipidemia Cardiovascular risk and specific lipid/LDL goals reviewed. We discussed several lifestyle modifications today and Ariel Ellis will continue her Low carbohydrate meal plan, and will continue to work on exercise and weight loss efforts. We will recheck labs every 3 months. Orders and follow up as documented in patient record.   Counseling Intensive lifestyle modifications are the first line treatment for this issue. . Dietary changes: Increase soluble fiber. Decrease simple carbohydrates. . Exercise changes: Moderate to vigorous-intensity aerobic activity 150 minutes per week if tolerated. . Lipid-lowering medications: see documented in medical record.  2. Vitamin D deficiency Low Vitamin D level contributes to fatigue and are associated with obesity, breast, and colon cancer. Ariel Ellis agreed to continue  taking prescription Vitamin D 50,000 IU every week as directed. She will follow-up for routine testing of Vitamin D, at least 2-3 times per year to avoid over-replacement.  3. Class 1 obesity with serious comorbidity and body mass index (BMI) of 30.0 to 30.9 in adult, unspecified obesity type Ariel Ellis is currently in the action stage of change. As such, her goal is to continue with weight loss efforts. She has agreed to following a lower carbohydrate, vegetable and lean protein rich diet plan.   Exercise goals: As is.  Behavioral modification strategies: increasing lean protein intake, meal planning and cooking strategies and travel eating strategies.  Ariel Ellis has agreed to follow-up with our clinic in 3 weeks. She was informed of the importance of frequent follow-up visits to maximize her success with intensive lifestyle modifications for her multiple health conditions.   Objective:   Blood pressure 136/73, pulse 71, temperature 98.1 F (36.7 C), temperature source Oral, height 5\' 5"  (1.651 m), weight 169 lb (76.7 kg), last menstrual period 04/05/2013, SpO2 97 %. Body mass index is 28.12 kg/m.  General: Cooperative, alert, well developed, in no acute distress. HEENT: Conjunctivae and lids unremarkable. Cardiovascular: Regular rhythm.  Lungs: Normal work of breathing. Neurologic: No focal deficits.   Lab Results  Component Value Date   CREATININE 0.76 07/16/2019   BUN 13 07/16/2019   NA 145 (H) 07/16/2019   K 5.1 07/16/2019   CL 107 (H) 07/16/2019   CO2 24 07/16/2019   Lab Results  Component Value Date   ALT 15 07/16/2019   AST 13 07/16/2019   ALKPHOS 100 07/16/2019   BILITOT 0.3 07/16/2019   Lab Results  Component Value Date  HGBA1C 5.6 07/16/2019   HGBA1C 5.6 03/07/2019   HGBA1C 5.8 (H) 10/16/2018   HGBA1C 5.9 (H) 06/12/2018   HGBA1C 5.8 (H) 02/22/2018   Lab Results  Component Value Date   INSULIN 4.3 07/16/2019   INSULIN 26.3 (H) 10/16/2018   INSULIN 10.3 06/12/2018    INSULIN 13.1 02/22/2018   Lab Results  Component Value Date   TSH 1.340 03/07/2019   Lab Results  Component Value Date   CHOL 200 (H) 07/16/2019   HDL 72 07/16/2019   LDLCALC 115 (H) 07/16/2019   TRIG 74 07/16/2019   CHOLHDL 3.5 01/06/2018   Lab Results  Component Value Date   WBC 5.8 01/06/2018   HGB 13.5 01/06/2018   HCT 41.2 01/06/2018   MCV 86 01/06/2018   PLT 272 01/06/2018   No results found for: IRON, TIBC, FERRITIN  Attestation Statements:   Reviewed by clinician on day of visit: allergies, medications, problem list, medical history, surgical history, family history, social history, and previous encounter notes.  Time spent on visit including pre-visit chart review and post-visit care and charting was 32 minutes.    Trude Mcburney, am acting as Energy manager for [employer], NP-C.  I have reviewed the above documentation for accuracy and completeness, and I agree with the above. -  Julaine Fusi, NP

## 2019-09-20 ENCOUNTER — Other Ambulatory Visit (INDEPENDENT_AMBULATORY_CARE_PROVIDER_SITE_OTHER): Payer: Self-pay | Admitting: Family Medicine

## 2019-09-20 DIAGNOSIS — E559 Vitamin D deficiency, unspecified: Secondary | ICD-10-CM

## 2019-10-02 ENCOUNTER — Other Ambulatory Visit: Payer: Self-pay

## 2019-10-02 ENCOUNTER — Encounter (INDEPENDENT_AMBULATORY_CARE_PROVIDER_SITE_OTHER): Payer: Self-pay | Admitting: Family Medicine

## 2019-10-02 ENCOUNTER — Ambulatory Visit (INDEPENDENT_AMBULATORY_CARE_PROVIDER_SITE_OTHER): Payer: 59 | Admitting: Family Medicine

## 2019-10-02 VITALS — BP 108/72 | HR 61 | Temp 98.3°F | Ht 65.0 in | Wt 169.0 lb

## 2019-10-02 DIAGNOSIS — E669 Obesity, unspecified: Secondary | ICD-10-CM

## 2019-10-02 DIAGNOSIS — Z6830 Body mass index (BMI) 30.0-30.9, adult: Secondary | ICD-10-CM

## 2019-10-02 DIAGNOSIS — E8881 Metabolic syndrome: Secondary | ICD-10-CM

## 2019-10-03 NOTE — Progress Notes (Signed)
Chief Complaint:   OBESITY Ariel Ellis is here to discuss her progress with her obesity treatment plan along with follow-up of her obesity related diagnoses. Ariel Ellis is on following a lower carbohydrate, vegetable and lean protein rich diet plan and states she is following her eating plan approximately 75% of the time. Ariel Ellis states she is walking for 50 minutes 3 times per week.  Today's visit was #: 35 Starting weight: 217 lbs Starting date: 02/22/2018 Today's weight: 169 lbs Today's date: 10/02/2019 Total lbs lost to date: 48 Total lbs lost since last in-office visit: 0  Interim History: Ariel Ellis has been a bit off the plan due to vacation and a work trip. She is now back on the plan. She is quite detail oriented and adheres to the plan very well. She is now eating some berries daily. She is eating 3 meals per day with protein and plenty of vegetables.   Subjective:   1. Insulin resistance Ariel Ellis has a diagnosis of insulin resistance based on her elevated fasting insulin level >5. She denies polyphagia. She continues to work on diet and exercise to decrease her risk of diabetes.  Lab Results  Component Value Date   INSULIN 4.3 07/16/2019   INSULIN 26.3 (H) 10/16/2018   INSULIN 10.3 06/12/2018   INSULIN 13.1 02/22/2018   Lab Results  Component Value Date   HGBA1C 5.6 07/16/2019   Assessment/Plan:   1. Insulin resistance Ariel Ellis will continue her meal plan, and will continue to work on weight loss, exercise, and decreasing simple carbohydrates to help decrease the risk of diabetes. Ariel Ellis agreed to follow-up with [place] as directed to closely monitor her progress.  2. Class 1 obesity with serious comorbidity and body mass index (BMI) of 30.0 to 30.9 in adult, unspecified obesity type Ariel Ellis is currently in the action stage of change. As such, her goal is to continue with weight loss efforts. She has agreed to following a lower carbohydrate, vegetable and lean protein rich diet plan.   We discussed being  aware of calories from cheese.  Exercise goals: As is.  Behavioral modification strategies: planning for success.  Ariel Ellis has agreed to follow-up with our clinic in 4 weeks. She was informed of the importance of frequent follow-up visits to maximize her success with intensive lifestyle modifications for her multiple health conditions.   Objective:   Blood pressure 108/72, pulse 61, temperature 98.3 F (36.8 C), temperature source Oral, height 5\' 5"  (1.651 m), weight 169 lb (76.7 kg), last menstrual period 04/05/2013, SpO2 96 %. Body mass index is 28.12 kg/m.  General: Cooperative, alert, well developed, in no acute distress. HEENT: Conjunctivae and lids unremarkable. Cardiovascular: Regular rhythm.  Lungs: Normal work of breathing. Neurologic: No focal deficits.   Lab Results  Component Value Date   CREATININE 0.76 07/16/2019   BUN 13 07/16/2019   NA 145 (H) 07/16/2019   K 5.1 07/16/2019   CL 107 (H) 07/16/2019   CO2 24 07/16/2019   Lab Results  Component Value Date   ALT 15 07/16/2019   AST 13 07/16/2019   ALKPHOS 100 07/16/2019   BILITOT 0.3 07/16/2019   Lab Results  Component Value Date   HGBA1C 5.6 07/16/2019   HGBA1C 5.6 03/07/2019   HGBA1C 5.8 (H) 10/16/2018   HGBA1C 5.9 (H) 06/12/2018   HGBA1C 5.8 (H) 02/22/2018   Lab Results  Component Value Date   INSULIN 4.3 07/16/2019   INSULIN 26.3 (H) 10/16/2018   INSULIN 10.3 06/12/2018  INSULIN 13.1 02/22/2018   Lab Results  Component Value Date   TSH 1.340 03/07/2019   Lab Results  Component Value Date   CHOL 200 (H) 07/16/2019   HDL 72 07/16/2019   LDLCALC 115 (H) 07/16/2019   TRIG 74 07/16/2019   CHOLHDL 3.5 01/06/2018   Lab Results  Component Value Date   WBC 5.8 01/06/2018   HGB 13.5 01/06/2018   HCT 41.2 01/06/2018   MCV 86 01/06/2018   PLT 272 01/06/2018   No results found for: IRON, TIBC, FERRITIN  Attestation Statements:   Reviewed by clinician on day of visit: allergies, medications,  problem list, medical history, surgical history, family history, social history, and previous encounter notes.   Trude Mcburney, am acting as Energy manager for [place], FNP-C.  I have reviewed the above documentation for accuracy and completeness, and I agree with the above. -  Jesse Sans, FNP

## 2019-10-30 ENCOUNTER — Encounter (INDEPENDENT_AMBULATORY_CARE_PROVIDER_SITE_OTHER): Payer: Self-pay | Admitting: Family Medicine

## 2019-10-30 ENCOUNTER — Other Ambulatory Visit: Payer: Self-pay

## 2019-10-30 ENCOUNTER — Ambulatory Visit (INDEPENDENT_AMBULATORY_CARE_PROVIDER_SITE_OTHER): Payer: 59 | Admitting: Family Medicine

## 2019-10-30 VITALS — BP 110/67 | HR 59 | Temp 97.7°F | Ht 65.0 in | Wt 171.0 lb

## 2019-10-30 DIAGNOSIS — Z6830 Body mass index (BMI) 30.0-30.9, adult: Secondary | ICD-10-CM | POA: Diagnosis not present

## 2019-10-30 DIAGNOSIS — E669 Obesity, unspecified: Secondary | ICD-10-CM

## 2019-10-30 DIAGNOSIS — E8881 Metabolic syndrome: Secondary | ICD-10-CM | POA: Diagnosis not present

## 2019-10-30 NOTE — Progress Notes (Addendum)
Chief Complaint:   OBESITY Ariel Ellis is here to discuss her progress with her obesity treatment plan along with follow-up of her obesity related diagnoses. Ariel Ellis is on following a lower carbohydrate, vegetable and lean protein rich diet plan and states she is following her eating plan approximately 90-95% of the time. Ariel Ellis states she is walking for 50 minutes 5 times per week.  Today's visit was #: 36 Starting weight: 217 lbs Starting date: 02/22/2018 Today's weight: 171 lbs Today's date: 10/30/2019 Total lbs lost to date: 46 Total lbs lost since last in-office visit: 0  Interim History: Ariel Ellis notes more she has been socializing more often recently  and she has had some visitors at her home. Both of these have led to an increase in her carb intake.  She notes she is not doing resistance training as often due to lack of time. She notes that exercise has always been an important component in weight loss for her.  Subjective:   1. Insulin resistance Lanette has a diagnosis of insulin resistance based on her elevated fasting insulin level >5. Krystale notes polyphagia is not an issue for her. She tends to eat on a schedule. Last A1c was 5.6. She continues to work on diet and exercise to decrease her risk of diabetes.  Lab Results  Component Value Date   INSULIN 4.3 07/16/2019   INSULIN 26.3 (H) 10/16/2018   INSULIN 10.3 06/12/2018   INSULIN 13.1 02/22/2018   Lab Results  Component Value Date   HGBA1C 5.6 07/16/2019   Assessment/Plan:   1. Insulin resistance Ariel Ellis will continue her meal plan, and will continue to work on weight loss, exercise, and decreasing simple carbohydrates to help decrease the risk of diabetes. Ariel Ellis agreed to follow-up with Ariel Ellis as directed to closely monitor her progress.  2. Class 1 obesity with serious comorbidity and body mass index (BMI) of 30.0 to 30.9 in adult, unspecified obesity type Ariel Ellis is currently in the action stage of change. As such, her goal is to continue with  weight loss efforts. She has agreed to following a lower carbohydrate, vegetable and lean protein rich diet plan.   Handout given today: Smart Fruit.  Exercise goals: Ariel Ellis is to increase resistance training.  Behavioral modification strategies: decreasing simple carbohydrates and decreasing liquid calories.  Ariel Ellis has agreed to follow-up with our clinic in 4 weeks. She was informed of the importance of frequent follow-up visits to maximize her success with intensive lifestyle modifications for her multiple health conditions.   Objective:   Blood pressure 110/67, pulse 59, temperature 97.7 F (36.5 C), temperature source Oral, height 5\' 5"  (1.651 m), weight 171 lb (77.6 kg), last menstrual period 04/05/2013, SpO2 98 %. Body mass index is 28.46 kg/m.  General: Cooperative, alert, well developed, in no acute distress. HEENT: Conjunctivae and lids unremarkable. Cardiovascular: Regular rhythm.  Lungs: Normal work of breathing. Neurologic: No focal deficits.   Lab Results  Component Value Date   CREATININE 0.76 07/16/2019   BUN 13 07/16/2019   NA 145 (H) 07/16/2019   K 5.1 07/16/2019   CL 107 (H) 07/16/2019   CO2 24 07/16/2019   Lab Results  Component Value Date   ALT 15 07/16/2019   AST 13 07/16/2019   ALKPHOS 100 07/16/2019   BILITOT 0.3 07/16/2019   Lab Results  Component Value Date   HGBA1C 5.6 07/16/2019   HGBA1C 5.6 03/07/2019   HGBA1C 5.8 (H) 10/16/2018   HGBA1C 5.9 (H) 06/12/2018  HGBA1C 5.8 (H) 02/22/2018   Lab Results  Component Value Date   INSULIN 4.3 07/16/2019   INSULIN 26.3 (H) 10/16/2018   INSULIN 10.3 06/12/2018   INSULIN 13.1 02/22/2018   Lab Results  Component Value Date   TSH 1.340 03/07/2019   Lab Results  Component Value Date   CHOL 200 (H) 07/16/2019   HDL 72 07/16/2019   LDLCALC 115 (H) 07/16/2019   TRIG 74 07/16/2019   CHOLHDL 3.5 01/06/2018   Lab Results  Component Value Date   WBC 5.8 01/06/2018   HGB 13.5 01/06/2018   HCT 41.2  01/06/2018   MCV 86 01/06/2018   PLT 272 01/06/2018   No results found for: IRON, TIBC, FERRITIN  Attestation Statements:   Reviewed by clinician on day of visit: allergies, medications, problem list, medical history, surgical history, family history, social history, and previous encounter notes.   Trude Mcburney, am acting as Energy manager for [place], FNP-C.  I have reviewed the above documentation for accuracy and completeness, and I agree with the above. -  Jesse Sans, FNP

## 2019-12-03 ENCOUNTER — Other Ambulatory Visit: Payer: Self-pay

## 2019-12-03 ENCOUNTER — Encounter (INDEPENDENT_AMBULATORY_CARE_PROVIDER_SITE_OTHER): Payer: Self-pay | Admitting: Family Medicine

## 2019-12-03 ENCOUNTER — Ambulatory Visit (INDEPENDENT_AMBULATORY_CARE_PROVIDER_SITE_OTHER): Payer: 59 | Admitting: Family Medicine

## 2019-12-03 VITALS — BP 104/70 | HR 60 | Temp 97.8°F | Ht 65.0 in | Wt 173.0 lb

## 2019-12-03 DIAGNOSIS — E559 Vitamin D deficiency, unspecified: Secondary | ICD-10-CM

## 2019-12-03 DIAGNOSIS — E669 Obesity, unspecified: Secondary | ICD-10-CM

## 2019-12-03 DIAGNOSIS — E8881 Metabolic syndrome: Secondary | ICD-10-CM

## 2019-12-03 DIAGNOSIS — Z6830 Body mass index (BMI) 30.0-30.9, adult: Secondary | ICD-10-CM | POA: Diagnosis not present

## 2019-12-03 DIAGNOSIS — E88819 Insulin resistance, unspecified: Secondary | ICD-10-CM

## 2019-12-03 MED ORDER — VITAMIN D (ERGOCALCIFEROL) 1.25 MG (50000 UNIT) PO CAPS: 50000.0000 [IU] | ORAL_CAPSULE | ORAL | 0 refills | Status: DC

## 2019-12-03 NOTE — Progress Notes (Signed)
Chief Complaint:   OBESITY Ariel Ellis is here to discuss her progress with her obesity treatment plan along with follow-up of her obesity related diagnoses. Ariel Ellis is on following a lower carbohydrate, vegetable and lean protein rich diet plan and states she is following her eating plan approximately 90-95% of the time. Ariel Ellis states she is walking 3 miles, and doing strengthening for 60 minutes 4-5 times per week.  Today's visit was #: 37 Starting weight: 217 lbs Starting date: 02/22/2018 Today's weight: 173 lbs Today's date: 12/03/2019 Total lbs lost to date: 44 Total lbs lost since last in-office visit: 0  Interim History: Ariel Ellis notes life has been extremely busy and she has not been able to focus on exercise or the meal plan. She is exercising less and she has had some alcohol and starchy vegetables.  Subjective:   1. Vitamin D deficiency Ariel Ellis's last Vit D level was slightly low at 47. She is on weekly prescription Vit D.  2. Insulin resistance Ariel Ellis has a diagnosis of insulin resistance based on her elevated fasting insulin level >5. She denies polyphagia, and she is not on metformin. She continues to work on diet and exercise to decrease her risk of diabetes.  Lab Results  Component Value Date   INSULIN 4.3 07/16/2019   INSULIN 26.3 (H) 10/16/2018   INSULIN 10.3 06/12/2018   INSULIN 13.1 02/22/2018   Lab Results  Component Value Date   HGBA1C 5.6 07/16/2019   Assessment/Plan:   1. Vitamin D deficiency Low Vitamin D level contributes to fatigue and are associated with obesity, breast, and colon cancer. We will refill prescription Vitamin D for 1 month. Ariel Ellis will follow-up for routine testing of Vitamin D, at least 2-3 times per year to avoid over-replacement.  - Vitamin D, Ergocalciferol, (DRISDOL) 1.25 MG (50000 UNIT) CAPS capsule; Take 1 capsule (50,000 Units total) by mouth every 7 (seven) days.  Dispense: 4 capsule; Refill: 0  2. Insulin resistance Ariel Ellis will continue her meal  plan, and will continue to work on weight loss, exercise, and decreasing simple carbohydrates to help decrease the risk of diabetes. Ariel Ellis agreed to follow-up with [place] as directed to closely monitor her progress.  3. Class 1 obesity with serious comorbidity and body mass index (BMI) of 30.0 to 30.9 in adult, unspecified obesity type Ariel Ellis is currently in the action stage of change. As such, her goal is to continue with weight loss efforts. She has agreed to following a lower carbohydrate, vegetable and lean protein rich diet plan.   Exercise goals: As is.  Behavioral modification strategies: decreasing simple carbohydrates.  Ariel Ellis has agreed to follow-up with our clinic in 4 weeks.  Objective:   Blood pressure 104/70, pulse 60, temperature 97.8 F (36.6 C), temperature source Oral, height 5\' 5"  (1.651 m), weight 173 lb (78.5 kg), last menstrual period 04/05/2013, SpO2 97 %. Body mass index is 28.79 kg/m.  General: Cooperative, alert, well developed, in no acute distress. HEENT: Conjunctivae and lids unremarkable. Cardiovascular: Regular rhythm.  Lungs: Normal work of breathing. Neurologic: No focal deficits.   Lab Results  Component Value Date   CREATININE 0.76 07/16/2019   BUN 13 07/16/2019   NA 145 (H) 07/16/2019   K 5.1 07/16/2019   CL 107 (H) 07/16/2019   CO2 24 07/16/2019   Lab Results  Component Value Date   ALT 15 07/16/2019   AST 13 07/16/2019   ALKPHOS 100 07/16/2019   BILITOT 0.3 07/16/2019   Lab Results  Component Value Date   HGBA1C 5.6 07/16/2019   HGBA1C 5.6 03/07/2019   HGBA1C 5.8 (H) 10/16/2018   HGBA1C 5.9 (H) 06/12/2018   HGBA1C 5.8 (H) 02/22/2018   Lab Results  Component Value Date   INSULIN 4.3 07/16/2019   INSULIN 26.3 (H) 10/16/2018   INSULIN 10.3 06/12/2018   INSULIN 13.1 02/22/2018   Lab Results  Component Value Date   TSH 1.340 03/07/2019   Lab Results  Component Value Date   CHOL 200 (H) 07/16/2019   HDL 72 07/16/2019   LDLCALC 115 (H)  07/16/2019   TRIG 74 07/16/2019   CHOLHDL 3.5 01/06/2018   Lab Results  Component Value Date   WBC 5.8 01/06/2018   HGB 13.5 01/06/2018   HCT 41.2 01/06/2018   MCV 86 01/06/2018   PLT 272 01/06/2018   No results found for: IRON, TIBC, FERRITIN  Attestation Statements:   Reviewed by clinician on day of visit: allergies, medications, problem list, medical history, surgical history, family history, social history, and previous encounter notes.   Trude Mcburney, am acting as Energy manager for [place], FNP-C.  I have reviewed the above documentation for accuracy and completeness, and I agree with the above. -  Jesse Sans, FNP

## 2019-12-29 ENCOUNTER — Other Ambulatory Visit (INDEPENDENT_AMBULATORY_CARE_PROVIDER_SITE_OTHER): Payer: Self-pay | Admitting: Family Medicine

## 2019-12-29 DIAGNOSIS — E559 Vitamin D deficiency, unspecified: Secondary | ICD-10-CM

## 2019-12-31 ENCOUNTER — Encounter (INDEPENDENT_AMBULATORY_CARE_PROVIDER_SITE_OTHER): Payer: Self-pay | Admitting: Family Medicine

## 2019-12-31 ENCOUNTER — Other Ambulatory Visit: Payer: Self-pay

## 2019-12-31 ENCOUNTER — Ambulatory Visit (INDEPENDENT_AMBULATORY_CARE_PROVIDER_SITE_OTHER): Payer: 59 | Admitting: Family Medicine

## 2019-12-31 VITALS — BP 108/53 | HR 68 | Temp 98.0°F | Ht 65.0 in | Wt 172.0 lb

## 2019-12-31 DIAGNOSIS — E559 Vitamin D deficiency, unspecified: Secondary | ICD-10-CM | POA: Diagnosis not present

## 2019-12-31 DIAGNOSIS — E7849 Other hyperlipidemia: Secondary | ICD-10-CM | POA: Diagnosis not present

## 2019-12-31 DIAGNOSIS — E8881 Metabolic syndrome: Secondary | ICD-10-CM

## 2019-12-31 DIAGNOSIS — E669 Obesity, unspecified: Secondary | ICD-10-CM

## 2019-12-31 DIAGNOSIS — Z6830 Body mass index (BMI) 30.0-30.9, adult: Secondary | ICD-10-CM

## 2019-12-31 DIAGNOSIS — E88819 Insulin resistance, unspecified: Secondary | ICD-10-CM

## 2019-12-31 MED ORDER — VITAMIN D (ERGOCALCIFEROL) 1.25 MG (50000 UNIT) PO CAPS: 50000.0000 [IU] | ORAL_CAPSULE | ORAL | 0 refills | Status: DC

## 2019-12-31 NOTE — Progress Notes (Signed)
Chief Complaint:   OBESITY Ariel Ellis is here to discuss her progress with her obesity treatment plan along with follow-up of her obesity related diagnoses. Ariel Ellis is on following a lower carbohydrate, vegetable and lean protein rich diet plan and states she is following her eating plan approximately 95% of the time. Ariel Ellis states she is doing cardio and strength for 50 minutes 5 times per week.  Today's visit was #: 38 Starting weight: 217 lbs Starting date: 02/22/2018 Today's weight: 172 lbs Today's date: 12/31/2019 Total lbs lost to date: 45 Total lbs lost since last in-office visit: 1  Interim History: Ariel Ellis's goal weight is between 160 and 165 lbs. She feels "stuck" at her current weight. She is having homemade minestrone soup at lunch with chicken but she reports it probably does not have enough protein in it.  She feels she may be eating < 1200 calories per day.  Subjective:   1. Vitamin D deficiency Ariel Ellis's last Vit D level was slightly low at 47.0. She is on prescription Vit D.  2. Insulin resistance Ariel Ellis has a diagnosis of insulin resistance based on her elevated fasting insulin level >5.   Lab Results  Component Value Date   INSULIN 4.3 07/16/2019   INSULIN 26.3 (H) 10/16/2018   INSULIN 10.3 06/12/2018   INSULIN 13.1 02/22/2018   Lab Results  Component Value Date   HGBA1C 5.6 07/16/2019   3. Other hyperlipidemia Ariel Ellis is not on statin, and her last LDL was elevated at 115. Her triglycerides and HDL were within normal limits.  10 yr ASCVD risk score is very low at 0.9%. The 10-year ASCVD risk score Ariel George DC Montez Hageman., et al., 2013) is: 0.9%   Values used to calculate the score:     Age: 54 years     Sex: Female     Is Non-Hispanic African American: No     Diabetic: No     Tobacco smoker: No     Systolic Blood Pressure: 108 mmHg     Is BP treated: No     HDL Cholesterol: 75 mg/dL     Total Cholesterol: 196 mg/dL   Lab Results  Component Value Date   ALT 15 07/16/2019   AST  13 07/16/2019   ALKPHOS 100 07/16/2019   BILITOT 0.3 07/16/2019   Lab Results  Component Value Date   CHOL 200 (H) 07/16/2019   HDL 72 07/16/2019   LDLCALC 115 (H) 07/16/2019   TRIG 74 07/16/2019   CHOLHDL 3.5 01/06/2018   Assessment/Plan:   1. Vitamin D deficiency Refill prescription Vitamin D for 1 month. .  - Vitamin D, Ergocalciferol, (DRISDOL) 1.25 MG (50000 UNIT) CAPS capsule; Take 1 capsule (50,000 Units total) by mouth every 7 (seven) days.  Dispense: 4 capsule; Refill: 0 - VITAMIN D 25 Hydroxy (Vit-D Deficiency, Fractures)  2. Insulin resistance  We will check labs today.  - Comprehensive metabolic panel - Hemoglobin A1c - Insulin, random  3. Other hyperlipidemia Mckinzy will continue to work on diet, exercise and weight loss efforts. We will check labs today.   - Lipid Panel With LDL/HDL Ratio  4. Class 1 obesity with serious comorbidity and body mass index (BMI) of 30.0 to 30.9 in adult, unspecified obesity type Debara is currently in the action stage of change. As such, her goal is to continue with weight loss efforts. She has agreed to keeping a food journal and adhering to recommended goals of 1150-1250 calories and 80 grams of protein  daily.   Exercise goals: As is.  Behavioral modification strategies: increasing lean protein intake and keeping a strict food journal.  Ariel Ellis has agreed to follow-up with our clinic in 4 weeks. Ariel Ellis was informed we would discuss her lab results at her next visit unless there is a critical issue that needs to be addressed sooner. Ariel Ellis agreed to keep her next visit at the agreed upon time to discuss these results.  Objective:   Blood pressure (!) 108/53, pulse 68, temperature 98 F (36.7 C), temperature source Oral, height 5\' 5"  (1.651 m), weight 172 lb (78 kg), last menstrual period 04/05/2013, SpO2 97 %. Body mass index is 28.62 kg/m.  General: Cooperative, alert, well developed, in no acute distress. HEENT: Conjunctivae and lids  unremarkable. Cardiovascular: Regular rhythm.  Lungs: Normal work of breathing. Neurologic: No focal deficits.   Lab Results  Component Value Date   CREATININE 0.76 07/16/2019   BUN 13 07/16/2019   NA 145 (H) 07/16/2019   K 5.1 07/16/2019   CL 107 (H) 07/16/2019   CO2 24 07/16/2019   Lab Results  Component Value Date   ALT 15 07/16/2019   AST 13 07/16/2019   ALKPHOS 100 07/16/2019   BILITOT 0.3 07/16/2019   Lab Results  Component Value Date   HGBA1C 5.6 07/16/2019   HGBA1C 5.6 03/07/2019   HGBA1C 5.8 (H) 10/16/2018   HGBA1C 5.9 (H) 06/12/2018   HGBA1C 5.8 (H) 02/22/2018   Lab Results  Component Value Date   INSULIN 4.3 07/16/2019   INSULIN 26.3 (H) 10/16/2018   INSULIN 10.3 06/12/2018   INSULIN 13.1 02/22/2018   Lab Results  Component Value Date   TSH 1.340 03/07/2019   Lab Results  Component Value Date   CHOL 200 (H) 07/16/2019   HDL 72 07/16/2019   LDLCALC 115 (H) 07/16/2019   TRIG 74 07/16/2019   CHOLHDL 3.5 01/06/2018   Lab Results  Component Value Date   WBC 5.8 01/06/2018   HGB 13.5 01/06/2018   HCT 41.2 01/06/2018   MCV 86 01/06/2018   PLT 272 01/06/2018   No results found for: IRON, TIBC, FERRITIN  Attestation Statements:   Reviewed by clinician on day of visit: allergies, medications, problem list, medical history, surgical history, family history, social history, and previous encounter notes.   03/08/2018, am acting as Trude Mcburney for Energy manager, FNP-C.  I have reviewed the above documentation for accuracy and completeness, and I agree with the above. -  [place], FNP

## 2020-01-01 LAB — COMPREHENSIVE METABOLIC PANEL
ALT: 18 IU/L (ref 0–32)
AST: 18 IU/L (ref 0–40)
Albumin/Globulin Ratio: 2.3 — ABNORMAL HIGH (ref 1.2–2.2)
Albumin: 4.3 g/dL (ref 3.8–4.9)
Alkaline Phosphatase: 86 IU/L (ref 44–121)
BUN/Creatinine Ratio: 27 — ABNORMAL HIGH (ref 9–23)
BUN: 20 mg/dL (ref 6–24)
Bilirubin Total: 0.2 mg/dL (ref 0.0–1.2)
CO2: 26 mmol/L (ref 20–29)
Calcium: 9 mg/dL (ref 8.7–10.2)
Chloride: 108 mmol/L — ABNORMAL HIGH (ref 96–106)
Creatinine, Ser: 0.74 mg/dL (ref 0.57–1.00)
GFR calc Af Amer: 106 mL/min/{1.73_m2} (ref >59)
GFR calc non Af Amer: 92 mL/min/{1.73_m2} (ref >59)
Globulin, Total: 1.9 g/dL (ref 1.5–4.5)
Glucose: 93 mg/dL (ref 65–99)
Potassium: 4.9 mmol/L (ref 3.5–5.2)
Sodium: 146 mmol/L — ABNORMAL HIGH (ref 134–144)
Total Protein: 6.2 g/dL (ref 6.0–8.5)

## 2020-01-01 LAB — INSULIN, RANDOM: INSULIN: 5.9 u[IU]/mL (ref 2.6–24.9)

## 2020-01-01 LAB — HEMOGLOBIN A1C
Est. average glucose Bld gHb Est-mCnc: 123 mg/dL
Hgb A1c MFr Bld: 5.9 % — ABNORMAL HIGH (ref 4.8–5.6)

## 2020-01-01 LAB — LIPID PANEL WITH LDL/HDL RATIO
Cholesterol, Total: 196 mg/dL (ref 100–199)
HDL: 75 mg/dL (ref >39)
LDL Chol Calc (NIH): 108 mg/dL — ABNORMAL HIGH (ref 0–99)
LDL/HDL Ratio: 1.4 ratio (ref 0.0–3.2)
Triglycerides: 71 mg/dL (ref 0–149)
VLDL Cholesterol Cal: 13 mg/dL (ref 5–40)

## 2020-01-01 LAB — VITAMIN D 25 HYDROXY (VIT D DEFICIENCY, FRACTURES): Vit D, 25-Hydroxy: 56.3 ng/mL (ref 30.0–100.0)
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
## 2020-01-16 ENCOUNTER — Other Ambulatory Visit: Payer: Self-pay

## 2020-01-16 ENCOUNTER — Ambulatory Visit: Payer: 59 | Admitting: Obstetrics and Gynecology

## 2020-01-16 ENCOUNTER — Encounter: Payer: Self-pay | Admitting: Obstetrics and Gynecology

## 2020-01-16 VITALS — BP 110/72 | HR 82 | Ht 64.5 in | Wt 175.0 lb

## 2020-01-16 DIAGNOSIS — Z01419 Encounter for gynecological examination (general) (routine) without abnormal findings: Secondary | ICD-10-CM

## 2020-01-16 NOTE — Patient Instructions (Signed)

## 2020-01-28 ENCOUNTER — Encounter (INDEPENDENT_AMBULATORY_CARE_PROVIDER_SITE_OTHER): Payer: Self-pay | Admitting: Family Medicine

## 2020-01-28 ENCOUNTER — Ambulatory Visit (INDEPENDENT_AMBULATORY_CARE_PROVIDER_SITE_OTHER): Payer: 59 | Admitting: Family Medicine

## 2020-01-28 ENCOUNTER — Other Ambulatory Visit: Payer: Self-pay

## 2020-01-28 VITALS — BP 105/68 | HR 77 | Temp 98.4°F | Ht 65.0 in | Wt 170.0 lb

## 2020-01-28 DIAGNOSIS — R7303 Prediabetes: Secondary | ICD-10-CM | POA: Diagnosis not present

## 2020-01-28 DIAGNOSIS — E669 Obesity, unspecified: Secondary | ICD-10-CM | POA: Diagnosis not present

## 2020-01-28 DIAGNOSIS — Z6830 Body mass index (BMI) 30.0-30.9, adult: Secondary | ICD-10-CM | POA: Diagnosis not present

## 2020-01-28 DIAGNOSIS — E559 Vitamin D deficiency, unspecified: Secondary | ICD-10-CM | POA: Diagnosis not present

## 2020-01-29 NOTE — Progress Notes (Signed)
Chief Complaint:   OBESITY Ariel Ellis is here to discuss her progress with her obesity treatment plan along with follow-up of her obesity related diagnoses. Ariel Ellis is following a lower carbohydrate, vegetable and lean protein rich diet plan and states she is following her eating plan approximately 90% of the time. Ariel Ellis states she is doing treadmill and weights 50 minutes 5 times per week.  Today's visit was #: 39 Starting weight: 217 lbs Starting date: 02/22/2018 Today's weight: 170 lbs Today's date: 01/28/2020 Total lbs lost to date: 47 Total lbs lost since last in-office visit: 2  Interim History: Ariel Ellis did not journal due to busy days at work related to recent [employer]. She has been eating less and skipping some meals. She does not feel she gets in enough protein on a low carb plan.  Subjective:   Prediabetes.  A1c is back up in the prediabetic range (5.9) from 5.6 even though she is down 8 lbs from the last time we checked her A1c and has been following our low carb plan. She has no family hx of diabetes. Ariel Ellis is not on metformin.   Lab Results  Component Value Date   HGBA1C 5.9 (H) 12/31/2019   Lab Results  Component Value Date   INSULIN 5.9 12/31/2019   INSULIN 4.3 07/16/2019   INSULIN 26.3 (H) 10/16/2018   INSULIN 10.3 06/12/2018   INSULIN 13.1 02/22/2018   Vitamin D deficiency. Vitamin D level is at goal. Ariel Ellis is on weekly prescription Vitamin D.   Ref. Range 12/31/2019 09:19  Vitamin D, 25-Hydroxy Latest Ref Range: 30.0 - 100.0 ng/mL 56.3   Assessment/Plan:   Prediabetes. . She will continue to follow the meal plan. We discussed metformin, but she declined.  Vitamin D deficiency. . She agrees to continue to take prescription Vitamin D as directed and will follow-up for routine testing of Vitamin D, at least 2-3 times per year to avoid over-replacement.  Class 1 obesity with serious comorbidity and body mass index (BMI) of 30.0 to 30.9 in adult,  unspecified obesity type - BMI > 30 at start of program.  Ariel Ellis is currently in the action stage of change. As such, her goal is to continue with weight loss efforts. She has agreed to following a lower carbohydrate, vegetable and lean protein rich diet plan with 75-80 grams of protein daily.   Exercise goals: Ariel Ellis will continue her current exercise regimen.   Behavioral modification strategies: increasing lean protein intake.  Ariel Ellis has agreed to follow-up with our clinic in 4 weeks.  Objective:   Blood pressure 105/68, pulse 77, temperature 98.4 F (36.9 C), height 5\' 5"  (1.651 m), weight 170 lb (77.1 kg), last menstrual period 04/05/2013, SpO2 97 %. Body mass index is 28.29 kg/m.  General: Cooperative, alert, well developed, in no acute distress. HEENT: Conjunctivae and lids unremarkable. Cardiovascular: Regular rhythm.  Lungs: Normal work of breathing. Neurologic: No focal deficits.   Lab Results  Component Value Date   CREATININE 0.74 12/31/2019   BUN 20 12/31/2019   NA 146 (H) 12/31/2019   K 4.9 12/31/2019   CL 108 (H) 12/31/2019   CO2 26 12/31/2019   Lab Results  Component Value Date   ALT 18 12/31/2019   AST 18 12/31/2019   ALKPHOS 86 12/31/2019   BILITOT 0.2 12/31/2019   Lab Results  Component Value Date   HGBA1C 5.9 (H) 12/31/2019   HGBA1C 5.6 07/16/2019   HGBA1C 5.6 03/07/2019  HGBA1C 5.8 (H) 10/16/2018   HGBA1C 5.9 (H) 06/12/2018   Lab Results  Component Value Date   INSULIN 5.9 12/31/2019   INSULIN 4.3 07/16/2019   INSULIN 26.3 (H) 10/16/2018   INSULIN 10.3 06/12/2018   INSULIN 13.1 02/22/2018   Lab Results  Component Value Date   TSH 1.340 03/07/2019   Lab Results  Component Value Date   CHOL 196 12/31/2019   HDL 75 12/31/2019   LDLCALC 108 (H) 12/31/2019   TRIG 71 12/31/2019   CHOLHDL 3.5 01/06/2018   Lab Results  Component Value Date   WBC 5.8 01/06/2018   HGB 13.5 01/06/2018   HCT 41.2 01/06/2018   MCV 86 01/06/2018   PLT 272  01/06/2018   No results found for: IRON, TIBC, FERRITIN  Attestation Statements:   Reviewed by clinician on day of visit: allergies, medications, problem list, medical history, surgical history, family history, social history, and previous encounter notes.  IMarianna Payment, am acting as Energy manager for [place], FNP-C   I have reviewed the above documentation for accuracy and completeness, and I agree with the above. -  Jesse Sans, FNP

## 2020-02-01 ENCOUNTER — Other Ambulatory Visit (INDEPENDENT_AMBULATORY_CARE_PROVIDER_SITE_OTHER): Payer: Self-pay | Admitting: Family Medicine

## 2020-02-01 DIAGNOSIS — E559 Vitamin D deficiency, unspecified: Secondary | ICD-10-CM

## 2020-02-04 NOTE — Telephone Encounter (Signed)
Med refill protocol sent to provider.

## 2020-02-04 NOTE — Telephone Encounter (Signed)
This patient was last seen by Adah Salvage, FNP and currently has an upcoming appt scheduled on 02/25/20 with her.

## 2020-02-12 ENCOUNTER — Other Ambulatory Visit: Payer: Self-pay | Admitting: Obstetrics and Gynecology

## 2020-02-12 DIAGNOSIS — Z1231 Encounter for screening mammogram for malignant neoplasm of breast: Secondary | ICD-10-CM

## 2020-02-25 ENCOUNTER — Ambulatory Visit (INDEPENDENT_AMBULATORY_CARE_PROVIDER_SITE_OTHER): Payer: 59 | Admitting: Family Medicine

## 2020-02-26 ENCOUNTER — Ambulatory Visit (INDEPENDENT_AMBULATORY_CARE_PROVIDER_SITE_OTHER): Payer: 59 | Admitting: Family Medicine

## 2020-02-26 ENCOUNTER — Encounter (INDEPENDENT_AMBULATORY_CARE_PROVIDER_SITE_OTHER): Payer: Self-pay | Admitting: Family Medicine

## 2020-02-26 ENCOUNTER — Other Ambulatory Visit: Payer: Self-pay

## 2020-02-26 VITALS — BP 112/56 | HR 74 | Temp 97.9°F | Ht 65.0 in | Wt 174.0 lb

## 2020-02-26 DIAGNOSIS — R7303 Prediabetes: Secondary | ICD-10-CM | POA: Diagnosis not present

## 2020-02-26 DIAGNOSIS — Z6830 Body mass index (BMI) 30.0-30.9, adult: Secondary | ICD-10-CM

## 2020-02-26 DIAGNOSIS — E669 Obesity, unspecified: Secondary | ICD-10-CM | POA: Diagnosis not present

## 2020-02-27 ENCOUNTER — Ambulatory Visit: Payer: Managed Care, Other (non HMO) | Admitting: Obstetrics and Gynecology

## 2020-02-27 NOTE — Progress Notes (Signed)
Chief Complaint:   OBESITY Ariel Ellis is here to discuss her progress with her obesity treatment plan along with follow-up of her obesity related diagnoses. Ariel Ellis is on following a lower carbohydrate, vegetable and lean protein rich diet plan and states she is following her eating plan approximately 95% of the time. Ariel Ellis states she is doing cardio and weights for 50 minutes 5 times per week.  Today's visit was #: 40 Starting weight: 217 lbs Starting date: 02/22/2018 Today's weight: 174 lbs Today's date: 02/26/2020 Total lbs lost to date: 43 Total lbs lost since last in-office visit: 0 (+4)  Interim History: Ariel Ellis was off the plan 2 days ago because she went on an anniversary trip. She feels her body has not had a chance to process extra carbohydrates and sodium. She isn't surprised with her gain of 4 lbs today. Ariel Ellis has been out of town the last few weekends. She tried to stick to the plan and make good choices. She has lost 29 lbs of fat and 17 lbs of muscle. Starting weight was 217 lbs on 02/22/18. She has done an amazing job on the plan. She want to get down to 160-165 lbs.  Subjective:   1. Pre-diabetes Last A1c was 5.9, up from 5.6 in April 2021. She denies polyphagia, and she is not on metformin. A1c was 5.8 at start of program.  Lab Results  Component Value Date   HGBA1C 5.9 (H) 12/31/2019   Lab Results  Component Value Date   INSULIN 5.9 12/31/2019   INSULIN 4.3 07/16/2019   INSULIN 26.3 (H) 10/16/2018   INSULIN 10.3 06/12/2018   INSULIN 13.1 02/22/2018   Assessment/Plan:   1. Pre-diabetes Ariel Ellis will continue her meal plan, and will continue to work on weight loss, exercise, and decreasing simple carbohydrates to help decrease the risk of diabetes.    2. Class 1 obesity with serious comorbidity and body mass index (BMI) of 30.0 to 30.9 in adult, unspecified obesity type Ariel Ellis is currently in the action stage of change. As such, her goal is to maintain weight for now. She has  agreed to following a lower carbohydrate, vegetable and lean protein rich diet plan (80 grams of protein).   Ariel Ellis's goal is to maintain her weight over the holidays. We will plan to recheck IC in January.  Handout was given today: Thanksgiving Tips.  Exercise goals: As is.  Behavioral modification strategies: decreasing simple carbohydrates and holiday eating strategies .  Ariel Ellis has agreed to follow-up with our clinic in 4 weeks.   Objective:   Blood pressure (!) 112/56, pulse 74, temperature 97.9 F (36.6 C), height 5\' 5"  (1.651 m), weight 174 lb (78.9 kg), last menstrual period 04/05/2013, SpO2 99 %. Body mass index is 28.96 kg/m.  General: Cooperative, alert, well developed, in no acute distress. HEENT: Conjunctivae and lids unremarkable. Cardiovascular: Regular rhythm.  Lungs: Normal work of breathing. Neurologic: No focal deficits.   Lab Results  Component Value Date   CREATININE 0.74 12/31/2019   BUN 20 12/31/2019   NA 146 (H) 12/31/2019   K 4.9 12/31/2019   CL 108 (H) 12/31/2019   CO2 26 12/31/2019   Lab Results  Component Value Date   ALT 18 12/31/2019   AST 18 12/31/2019   ALKPHOS 86 12/31/2019   BILITOT 0.2 12/31/2019   Lab Results  Component Value Date   HGBA1C 5.9 (H) 12/31/2019   HGBA1C 5.6 07/16/2019   HGBA1C 5.6 03/07/2019   HGBA1C 5.8 (H)  10/16/2018   HGBA1C 5.9 (H) 06/12/2018   Lab Results  Component Value Date   INSULIN 5.9 12/31/2019   INSULIN 4.3 07/16/2019   INSULIN 26.3 (H) 10/16/2018   INSULIN 10.3 06/12/2018   INSULIN 13.1 02/22/2018   Lab Results  Component Value Date   TSH 1.340 03/07/2019   Lab Results  Component Value Date   CHOL 196 12/31/2019   HDL 75 12/31/2019   LDLCALC 108 (H) 12/31/2019   TRIG 71 12/31/2019   CHOLHDL 3.5 01/06/2018   Lab Results  Component Value Date   WBC 5.8 01/06/2018   HGB 13.5 01/06/2018   HCT 41.2 01/06/2018   MCV 86 01/06/2018   PLT 272 01/06/2018   No results found for: IRON, TIBC,  FERRITIN  Attestation Statements:   Reviewed by clinician on day of visit: allergies, medications, problem list, medical history, surgical history, family history, social history, and previous encounter notes.   Trude Mcburney, am acting as Energy manager for [place], FNP-C.  I have reviewed the above documentation for accuracy and completeness, and I agree with the above. - Ariel Sans, FNP

## 2020-02-28 ENCOUNTER — Encounter (INDEPENDENT_AMBULATORY_CARE_PROVIDER_SITE_OTHER): Payer: Self-pay | Admitting: Family Medicine

## 2020-03-21 ENCOUNTER — Other Ambulatory Visit: Payer: Self-pay

## 2020-03-21 ENCOUNTER — Ambulatory Visit
Admission: RE | Admit: 2020-03-21 | Discharge: 2020-03-21 | Disposition: A | Discharge status: Home / Self Care | Payer: 59 | Source: Ambulatory Visit | Attending: Obstetrics and Gynecology | Admitting: Obstetrics and Gynecology

## 2020-03-21 DIAGNOSIS — Z1231 Encounter for screening mammogram for malignant neoplasm of breast: Secondary | ICD-10-CM

## 2020-03-25 ENCOUNTER — Other Ambulatory Visit: Payer: Self-pay

## 2020-03-25 ENCOUNTER — Encounter (INDEPENDENT_AMBULATORY_CARE_PROVIDER_SITE_OTHER): Payer: Self-pay | Admitting: Family Medicine

## 2020-03-25 ENCOUNTER — Ambulatory Visit (INDEPENDENT_AMBULATORY_CARE_PROVIDER_SITE_OTHER): Payer: 59 | Admitting: Family Medicine

## 2020-03-25 VITALS — BP 117/73 | HR 75 | Temp 98.0°F | Ht 65.0 in | Wt 172.0 lb

## 2020-03-25 DIAGNOSIS — Z6830 Body mass index (BMI) 30.0-30.9, adult: Secondary | ICD-10-CM

## 2020-03-25 DIAGNOSIS — E559 Vitamin D deficiency, unspecified: Secondary | ICD-10-CM | POA: Diagnosis not present

## 2020-03-25 DIAGNOSIS — R7303 Prediabetes: Secondary | ICD-10-CM

## 2020-03-25 DIAGNOSIS — E669 Obesity, unspecified: Secondary | ICD-10-CM

## 2020-03-25 MED ORDER — VITAMIN D (ERGOCALCIFEROL) 1.25 MG (50000 UNIT) PO CAPS: 50000.0000 [IU] | ORAL_CAPSULE | ORAL | 0 refills | Status: DC

## 2020-03-25 NOTE — Progress Notes (Signed)
Chief Complaint:   OBESITY Ariel Ellis is here to discuss her progress with her obesity treatment plan along with follow-up of her obesity related diagnoses. Ariel Ellis is on following a lower carbohydrate, vegetable and lean protein rich diet plan and states she is following her eating plan approximately 95% of the time. Ariel Ellis states she is walking for 50 minutes 5 times per week.  Today's visit was #: 41 Starting weight: 217 lbs Starting date: 02/22/2018 Today's weight: 172 lbs Today's date: 03/25/2020 Total lbs lost to date: 45 lbs Total lbs lost since last in-office visit: 2 lbs  Interim History: Ariel Ellis has not been getting in her resistance training, but is getting in her cardio.  She is adhering to the plan very well and is 7 pounds away from her goal of 165 pounds. She has lost 45 lbs overall. She says that she has lost 50 lbs before and regained it.   Subjective:   1. Vitamin D deficiency Vitamin D is at goal at 56.3.  She is on weekly vitamin D.  2. Prediabetes Last A1c was 5.9.  She is not on metformin.  A1c is up to 5.9 from 5.6.  She is diligent with low carb meal plan and exercise. She does not have family hx of diabetes. Increase in A1c is puzzling given weight loss, exercise, and low carb meal plan.   Lab Results  Component Value Date   HGBA1C 5.9 (H) 12/31/2019   Lab Results  Component Value Date   INSULIN 5.9 12/31/2019   INSULIN 4.3 07/16/2019   INSULIN 26.3 (H) 10/16/2018   INSULIN 10.3 06/12/2018   INSULIN 13.1 02/22/2018   Assessment/Plan:   1. Vitamin D deficiency Refill vitamin D 50,000 IU weekly.  2. Prediabetes Continue low carb meal plan and exercise.  3. Class 1 obesity with serious comorbidity and body mass index (BMI) of 30.0 to 30.9 in adult, unspecified obesity type  Ariel Ellis is currently in the action stage of change. As such, her goal is to continue with weight loss efforts. She has agreed to following a lower carbohydrate, vegetable and lean protein rich  diet plan.   Exercise goals: Resume resistance training.  Behavioral modification strategies: holiday eating strategies .  Ariel Ellis has agreed to follow-up with our clinic in 4 weeks.  IC at next visit.   Objective:   Blood pressure 117/73, pulse 75, temperature 98 F (36.7 C), height 5\' 5"  (1.651 m), weight 172 lb (78 kg), last menstrual period 04/05/2013, SpO2 98 %. Body mass index is 28.62 kg/m.  General: Cooperative, alert, well developed, in no acute distress. HEENT: Conjunctivae and lids unremarkable. Cardiovascular: Regular rhythm.  Lungs: Normal work of breathing. Neurologic: No focal deficits.   Lab Results  Component Value Date   CREATININE 0.74 12/31/2019   BUN 20 12/31/2019   NA 146 (H) 12/31/2019   K 4.9 12/31/2019   CL 108 (H) 12/31/2019   CO2 26 12/31/2019   Lab Results  Component Value Date   ALT 18 12/31/2019   AST 18 12/31/2019   ALKPHOS 86 12/31/2019   BILITOT 0.2 12/31/2019   Lab Results  Component Value Date   HGBA1C 5.9 (H) 12/31/2019   HGBA1C 5.6 07/16/2019   HGBA1C 5.6 03/07/2019   HGBA1C 5.8 (H) 10/16/2018   HGBA1C 5.9 (H) 06/12/2018   Lab Results  Component Value Date   INSULIN 5.9 12/31/2019   INSULIN 4.3 07/16/2019   INSULIN 26.3 (H) 10/16/2018   INSULIN 10.3 06/12/2018  INSULIN 13.1 02/22/2018   Lab Results  Component Value Date   TSH 1.340 03/07/2019   Lab Results  Component Value Date   CHOL 196 12/31/2019   HDL 75 12/31/2019   LDLCALC 108 (H) 12/31/2019   TRIG 71 12/31/2019   CHOLHDL 3.5 01/06/2018   Lab Results  Component Value Date   WBC 5.8 01/06/2018   HGB 13.5 01/06/2018   HCT 41.2 01/06/2018   MCV 86 01/06/2018   PLT 272 01/06/2018   Attestation Statements:   Reviewed by clinician on day of visit: allergies, medications, problem list, medical history, surgical history, family history, social history, and previous encounter notes.  I, Insurance claims handler, CMA, am acting as Energy manager for [place],  FNP.  I have reviewed the above documentation for accuracy and completeness, and I agree with the above. - Jesse Sans, FNP

## 2020-03-27 ENCOUNTER — Other Ambulatory Visit: Payer: Self-pay | Admitting: Obstetrics and Gynecology

## 2020-03-27 DIAGNOSIS — N6489 Other specified disorders of breast: Secondary | ICD-10-CM

## 2020-04-14 ENCOUNTER — Ambulatory Visit
Admission: RE | Admit: 2020-04-14 | Discharge: 2020-04-14 | Disposition: A | Discharge status: Home / Self Care | Payer: BC Managed Care – PPO | Source: Ambulatory Visit | Attending: Obstetrics and Gynecology | Admitting: Obstetrics and Gynecology

## 2020-04-14 ENCOUNTER — Ambulatory Visit
Admission: RE | Admit: 2020-04-14 | Discharge: 2020-04-14 | Disposition: A | Discharge status: Home / Self Care | Payer: 59 | Source: Ambulatory Visit | Attending: Obstetrics and Gynecology | Admitting: Obstetrics and Gynecology

## 2020-04-14 ENCOUNTER — Other Ambulatory Visit: Payer: Self-pay

## 2020-04-14 DIAGNOSIS — R928 Other abnormal and inconclusive findings on diagnostic imaging of breast: Secondary | ICD-10-CM | POA: Diagnosis not present

## 2020-04-14 DIAGNOSIS — N6489 Other specified disorders of breast: Secondary | ICD-10-CM

## 2020-04-14 DIAGNOSIS — N6011 Diffuse cystic mastopathy of right breast: Secondary | ICD-10-CM | POA: Diagnosis not present

## 2020-04-22 ENCOUNTER — Telehealth (INDEPENDENT_AMBULATORY_CARE_PROVIDER_SITE_OTHER): Payer: BC Managed Care – PPO | Admitting: Family Medicine

## 2020-04-22 ENCOUNTER — Encounter (INDEPENDENT_AMBULATORY_CARE_PROVIDER_SITE_OTHER): Payer: Self-pay | Admitting: Family Medicine

## 2020-04-22 ENCOUNTER — Telehealth (INDEPENDENT_AMBULATORY_CARE_PROVIDER_SITE_OTHER): Payer: Self-pay

## 2020-04-22 ENCOUNTER — Other Ambulatory Visit: Payer: Self-pay

## 2020-04-22 DIAGNOSIS — Z6830 Body mass index (BMI) 30.0-30.9, adult: Secondary | ICD-10-CM

## 2020-04-22 DIAGNOSIS — E669 Obesity, unspecified: Secondary | ICD-10-CM | POA: Diagnosis not present

## 2020-04-22 DIAGNOSIS — R7303 Prediabetes: Secondary | ICD-10-CM

## 2020-04-22 DIAGNOSIS — E559 Vitamin D deficiency, unspecified: Secondary | ICD-10-CM

## 2020-04-22 MED ORDER — VITAMIN D (ERGOCALCIFEROL) 1.25 MG (50000 UNIT) PO CAPS: 50000.0000 [IU] | ORAL_CAPSULE | ORAL | 0 refills | Status: DC

## 2020-04-22 NOTE — Telephone Encounter (Signed)
Verbal consent obtained to conduct video visit, via telehealth 

## 2020-04-24 NOTE — Progress Notes (Signed)
TeleHealth Visit:  Due to the COVID-19 pandemic, this visit was completed with telemedicine (audio/video) technology to reduce patient and provider exposure as well as to preserve personal protective equipment.   Ariel Ellis has verbally consented to this TeleHealth visit. The patient is located at home, the provider is located at the [facility] office. The participants in this visit include the listed provider and patient. The visit was conducted today via MyChart video.  Chief Complaint: OBESITY Ariel Ellis is here to discuss her progress with her obesity treatment plan along with follow-up of her obesity related diagnoses. Ariel Ellis is on following a lower carbohydrate, vegetable and lean protein rich diet plan and states she is following her eating plan approximately 90% of the time.   Today's visit was #: 42 Starting weight: 217 lbs Starting date: 02/22/2018  Interim History: Ariel Ellis feels she has maintained her weight at 174 pounds.  She has been consistent with exercise.  She is noting sugar cravings since having more carbs over Christmas.  She is up 4 pounds over the holidays.  Her goal weight is 160-175 pounds.  She is traveling to [facility] this weekend for work.  Subjective:   1. Vitamin D deficiency Vitamin D level is low at 56.31.  She is on weekly prescription vitamin D.  2. Prediabetes A1c increased back into prediabetic range (5.9) despite weight loss and low carb eating plan.  She does not want to start metformin.   Lab Results  Component Value Date   HGBA1C 5.9 (H) 12/31/2019   Lab Results  Component Value Date   INSULIN 5.9 12/31/2019   INSULIN 4.3 07/16/2019   INSULIN 26.3 (H) 10/16/2018   INSULIN 10.3 06/12/2018   INSULIN 13.1 02/22/2018   Assessment/Plan:   1. Vitamin D deficiency Will refill vitamin D 50,000 IU weekly, as per below.  -Refill Vitamin D, Ergocalciferol, (DRISDOL) 1.25 MG (50000 UNIT) CAPS capsule; Take 1 capsule (50,000 Units total) by mouth  every 7 (seven) days.  Dispense: 4 capsule; Refill: 0  2. Prediabetes Continue meal plan and weight loss.  3. Class 1 obesity with serious comorbidity and body mass index (BMI) of 30.0 to 30.9 in adult, unspecified obesity type  Ariel Ellis is currently in the action stage of change. As such, her goal is to continue with weight loss efforts. She has agreed to following a lower carbohydrate, vegetable and lean protein rich diet plan.   Exercise goals: As is.  Behavioral modification strategies: better snacking choices and travel eating strategies.  Ariel Ellis has agreed to follow-up with our clinic in 4 weeks, fasting for IC and labs.  Objective:   VITALS: Per patient if applicable, see vitals. GENERAL: Alert and in no acute distress. CARDIOPULMONARY: No increased WOB. Speaking in clear sentences.  PSYCH: Pleasant and cooperative. Speech normal rate and rhythm. Affect is appropriate. Insight and judgement are appropriate. Attention is focused, linear, and appropriate.  NEURO: Oriented as arrived to appointment on time with no prompting.   Lab Results  Component Value Date   CREATININE 0.74 12/31/2019   BUN 20 12/31/2019   NA 146 (H) 12/31/2019   K 4.9 12/31/2019   CL 108 (H) 12/31/2019   CO2 26 12/31/2019   Lab Results  Component Value Date   ALT 18 12/31/2019   AST 18 12/31/2019   ALKPHOS 86 12/31/2019   BILITOT 0.2 12/31/2019   Lab Results  Component Value Date   HGBA1C 5.9 (H) 12/31/2019   HGBA1C 5.6 07/16/2019  HGBA1C 5.6 03/07/2019   HGBA1C 5.8 (H) 10/16/2018   HGBA1C 5.9 (H) 06/12/2018   Lab Results  Component Value Date   INSULIN 5.9 12/31/2019   INSULIN 4.3 07/16/2019   INSULIN 26.3 (H) 10/16/2018   INSULIN 10.3 06/12/2018   INSULIN 13.1 02/22/2018   Lab Results  Component Value Date   TSH 1.340 03/07/2019   Lab Results  Component Value Date   CHOL 196 12/31/2019   HDL 75 12/31/2019   LDLCALC 108 (H) 12/31/2019   TRIG 71 12/31/2019   CHOLHDL 3.5 01/06/2018    Lab Results  Component Value Date   WBC 5.8 01/06/2018   HGB 13.5 01/06/2018   HCT 41.2 01/06/2018   MCV 86 01/06/2018   PLT 272 01/06/2018   Attestation Statements:   Reviewed by clinician on day of visit: allergies, medications, problem list, medical history, surgical history, family history, social history, and previous encounter notes.  I, Insurance claims handler, CMA, am acting as Energy manager for [place], FNP.  I have reviewed the above documentation for accuracy and completeness, and I agree with the above. - Jesse Sans, FNP

## 2020-04-26 ENCOUNTER — Encounter (INDEPENDENT_AMBULATORY_CARE_PROVIDER_SITE_OTHER): Payer: Self-pay | Admitting: Family Medicine

## 2020-05-19 ENCOUNTER — Encounter (INDEPENDENT_AMBULATORY_CARE_PROVIDER_SITE_OTHER): Payer: Self-pay | Admitting: Family Medicine

## 2020-05-19 ENCOUNTER — Other Ambulatory Visit: Payer: Self-pay

## 2020-05-19 ENCOUNTER — Ambulatory Visit (INDEPENDENT_AMBULATORY_CARE_PROVIDER_SITE_OTHER): Payer: BC Managed Care – PPO | Admitting: Family Medicine

## 2020-05-19 VITALS — BP 109/67 | HR 70 | Temp 97.8°F | Ht 65.0 in | Wt 176.0 lb

## 2020-05-19 DIAGNOSIS — R0602 Shortness of breath: Secondary | ICD-10-CM | POA: Diagnosis not present

## 2020-05-19 DIAGNOSIS — Z9189 Other specified personal risk factors, not elsewhere classified: Secondary | ICD-10-CM

## 2020-05-19 DIAGNOSIS — E7849 Other hyperlipidemia: Secondary | ICD-10-CM | POA: Diagnosis not present

## 2020-05-19 DIAGNOSIS — E559 Vitamin D deficiency, unspecified: Secondary | ICD-10-CM

## 2020-05-19 DIAGNOSIS — R7303 Prediabetes: Secondary | ICD-10-CM | POA: Diagnosis not present

## 2020-05-19 DIAGNOSIS — Z6830 Body mass index (BMI) 30.0-30.9, adult: Secondary | ICD-10-CM

## 2020-05-19 DIAGNOSIS — E669 Obesity, unspecified: Secondary | ICD-10-CM

## 2020-05-19 MED ORDER — VITAMIN D (ERGOCALCIFEROL) 1.25 MG (50000 UNIT) PO CAPS: 50000.0000 [IU] | ORAL_CAPSULE | ORAL | 0 refills | Status: DC

## 2020-05-19 NOTE — Progress Notes (Signed)
The 10-year ASCVD risk score Denman George DC Montez Hageman., et al., 2013) is: 0.9%   Values used to calculate the score:     Age: 55 years     Sex: Female     Is Non-Hispanic African American: No     Diabetic: No     Tobacco smoker: No     Systolic Blood Pressure: 109 mmHg     Is BP treated: No     HDL Cholesterol: 75 mg/dL     Total Cholesterol: 196 mg/dL

## 2020-05-20 LAB — COMPREHENSIVE METABOLIC PANEL
ALT: 15 IU/L (ref 0–32)
AST: 18 IU/L (ref 0–40)
Albumin/Globulin Ratio: 1.8 (ref 1.2–2.2)
Albumin: 4.2 g/dL (ref 3.8–4.9)
Alkaline Phosphatase: 84 IU/L (ref 44–121)
BUN/Creatinine Ratio: 19 (ref 9–23)
BUN: 16 mg/dL (ref 6–24)
Bilirubin Total: 0.4 mg/dL (ref 0.0–1.2)
CO2: 24 mmol/L (ref 20–29)
Calcium: 9 mg/dL (ref 8.7–10.2)
Chloride: 107 mmol/L — ABNORMAL HIGH (ref 96–106)
Creatinine, Ser: 0.85 mg/dL (ref 0.57–1.00)
GFR calc Af Amer: 90 mL/min/{1.73_m2} (ref >59)
GFR calc non Af Amer: 78 mL/min/{1.73_m2} (ref >59)
Globulin, Total: 2.3 g/dL (ref 1.5–4.5)
Glucose: 87 mg/dL (ref 65–99)
Potassium: 4.5 mmol/L (ref 3.5–5.2)
Sodium: 145 mmol/L — ABNORMAL HIGH (ref 134–144)
Total Protein: 6.5 g/dL (ref 6.0–8.5)

## 2020-05-20 LAB — LIPID PANEL WITH LDL/HDL RATIO
Cholesterol, Total: 214 mg/dL — ABNORMAL HIGH (ref 100–199)
HDL: 79 mg/dL (ref >39)
LDL Chol Calc (NIH): 121 mg/dL — ABNORMAL HIGH (ref 0–99)
LDL/HDL Ratio: 1.5 ratio (ref 0.0–3.2)
Triglycerides: 81 mg/dL (ref 0–149)
VLDL Cholesterol Cal: 14 mg/dL (ref 5–40)

## 2020-05-20 LAB — HEMOGLOBIN A1C
Est. average glucose Bld gHb Est-mCnc: 114 mg/dL
Hgb A1c MFr Bld: 5.6 % (ref 4.8–5.6)

## 2020-05-20 LAB — INSULIN, RANDOM: INSULIN: 4.4 u[IU]/mL (ref 2.6–24.9)

## 2020-05-20 LAB — VITAMIN D 25 HYDROXY (VIT D DEFICIENCY, FRACTURES): Vit D, 25-Hydroxy: 59.4 ng/mL (ref 30.0–100.0)

## 2020-05-20 NOTE — Progress Notes (Signed)
Chief Complaint:   OBESITY Ariel Ellis is here to discuss her progress with her obesity treatment plan along with follow-up of her obesity related diagnoses. Ariel Ellis is on following a lower carbohydrate, vegetable and lean protein rich diet plan and states she is following her eating plan approximately 95% of the time. Ariel Ellis states she is doing cardio and weights 60 minutes 6 times per week.  Today's visit was #: 43 Starting weight: 217 lbs Starting date: 02/22/2018 Today's weight: 176 lbs Today's date: 05/19/2020 Total lbs lost to date: 41 lbs Total lbs lost since last in-office visit: 0  Interim History: Ariel Ellis is up 4 lbs today, which she feels is due to recent travel. It is a small bit of water weight. She is very adherent to plan and exercise. She is frustrated with lack of weight loss. RMR has increased to 1761 from 1544, which is an increase of 217 cal per day.  Subjective:   1. SOB (shortness of breath) on exertion Ariel Ellis notes increasing shortness of breath with exercising.. She notes getting out of breath sooner with activity than she used to. This has not gotten worse recently. Ariel Ellis denies shortness of breath at rest or orthopnea.  2. Vitamin D deficiency Ariel Ellis's Vitamin D level was 56.3 on 12/31/2019. She is currently taking prescription vitamin D 50,000 IU each week. She denies nausea, vomiting or muscle weakness.   Ref. Range 12/31/2019 09:19  Vitamin D, 25-Hydroxy Latest Ref Range: 30.0 - 100.0 ng/mL 56.3   3. Other hyperlipidemia Pt's las LDL was slightly elevated at 108. Her triglycerides and HDL are within normal limits. She is not on statin therapy. Her ASCVD 10 year risk score is 0.9%.  4. Pre-diabetes Pt's last A1c was elevated at 5.9. She has no known family history of diabetes mellitus.  5. At risk for impaired metabolic function Ariel Ellis is at increased risk for impaired metabolic function due to pre-diabetes, current nutrition and muscle mass.  Assessment/Plan:   1. SOB  (shortness of breath) on exertion IC completed today  2. Vitamin D deficiency . She agrees to continue to take prescription Vitamin D @50 ,000 IU every week and will follow-up for routine testing of Vitamin D, at least 2-3 times per year to avoid over-replacement. Check labs today.  - VITAMIN D 25 Hydroxy (Vit-D Deficiency, Fractures)  - Vitamin D, Ergocalciferol, (DRISDOL) 1.25 MG (50000 UNIT) CAPS capsule; Take 1 capsule (50,000 Units total) by mouth every 7 (seven) days.  Dispense: 4 capsule; Refill: 0  3. Other hyperlipidemia . Check labs today.  - Lipid Panel With LDL/HDL Ratio  4. Pre-diabetes Ariel Ellis will continue to work on weight loss, exercise, and decreasing simple carbohydrates to help decrease the risk of diabetes. Check labs today.  - Hemoglobin A1c - Comprehensive metabolic panel - Insulin, random  5. At risk for impaired metabolic function Ariel Ellis was given approximately 15 minutes of impaired  metabolic function prevention counseling today. We discussed intensive lifestyle modifications today with an emphasis on specific nutrition and exercise instructions and strategies.   Repetitive spaced learning was employed today to elicit superior memory formation and behavioral change.  6. Class 1 obesity with serious comorbidity and body mass index (BMI) of 30.0 to 30.9 in adult, unspecified obesity type Ariel Ellis is currently in the action stage of change. As such, her goal is to continue with weight loss efforts. She has agreed to change to keeping a food journal and adhering to recommended goals of 1200-1300 calories and 85 g  protein.   Exercise goals: As is  Behavioral modification strategies: keeping a strict food journal.  Ariel Ellis has agreed to follow-up with our clinic in 4 weeks.  Ariel Ellis was informed we would discuss her lab results at her next visit unless there is a critical issue that needs to be addressed sooner. Ariel Ellis agreed to keep her next visit at the agreed upon time to discuss  these results.  Objective:   Blood pressure 109/67, pulse 70, temperature 97.8 F (36.6 C), height 5\' 5"  (1.651 m), weight 176 lb (79.8 kg), last menstrual period 04/05/2013, SpO2 97 %. Body mass index is 29.29 kg/m.  General: Cooperative, alert, well developed, in no acute distress. HEENT: Conjunctivae and lids unremarkable. Cardiovascular: Regular rhythm.  Lungs: Normal work of breathing. Neurologic: No focal deficits.   Lab Results  Component Value Date   CREATININE 0.85 05/19/2020   BUN 16 05/19/2020   NA 145 (H) 05/19/2020   K 4.5 05/19/2020   CL 107 (H) 05/19/2020   CO2 24 05/19/2020   Lab Results  Component Value Date   ALT 15 05/19/2020   AST 18 05/19/2020   ALKPHOS 84 05/19/2020   BILITOT 0.4 05/19/2020   Lab Results  Component Value Date   HGBA1C 5.6 05/19/2020   HGBA1C 5.9 (H) 12/31/2019   HGBA1C 5.6 07/16/2019   HGBA1C 5.6 03/07/2019   HGBA1C 5.8 (H) 10/16/2018   Lab Results  Component Value Date   INSULIN 4.4 05/19/2020   INSULIN 5.9 12/31/2019   INSULIN 4.3 07/16/2019   INSULIN 26.3 (H) 10/16/2018   INSULIN 10.3 06/12/2018   Lab Results  Component Value Date   TSH 1.340 03/07/2019   Lab Results  Component Value Date   CHOL 214 (H) 05/19/2020   HDL 79 05/19/2020   LDLCALC 121 (H) 05/19/2020   TRIG 81 05/19/2020   CHOLHDL 3.5 01/06/2018   Lab Results  Component Value Date   WBC 5.8 01/06/2018   HGB 13.5 01/06/2018   HCT 41.2 01/06/2018   MCV 86 01/06/2018   PLT 272 01/06/2018    Attestation Statements:   Reviewed by clinician on day of visit: allergies, medications, problem list, medical history, surgical history, family history, social history, and previous encounter notes.  03/08/2018, am acting as Edmund Hilda for Energy manager, FNP.  I have reviewed the above documentation for accuracy and completeness, and I agree with the above. -  [place], FNP

## 2020-05-21 ENCOUNTER — Encounter (INDEPENDENT_AMBULATORY_CARE_PROVIDER_SITE_OTHER): Payer: Self-pay | Admitting: Family Medicine

## 2020-06-06 DIAGNOSIS — W5501XA Bitten by cat, initial encounter: Secondary | ICD-10-CM | POA: Diagnosis not present

## 2020-06-06 DIAGNOSIS — L03119 Cellulitis of unspecified part of limb: Secondary | ICD-10-CM | POA: Diagnosis not present

## 2020-06-16 ENCOUNTER — Ambulatory Visit (INDEPENDENT_AMBULATORY_CARE_PROVIDER_SITE_OTHER): Payer: BC Managed Care – PPO | Admitting: Family Medicine

## 2020-07-09 ENCOUNTER — Ambulatory Visit (INDEPENDENT_AMBULATORY_CARE_PROVIDER_SITE_OTHER): Payer: BC Managed Care – PPO | Admitting: Adult Health

## 2020-07-09 ENCOUNTER — Encounter (INDEPENDENT_AMBULATORY_CARE_PROVIDER_SITE_OTHER): Payer: Self-pay | Admitting: Adult Health

## 2020-07-09 ENCOUNTER — Other Ambulatory Visit: Payer: Self-pay

## 2020-07-09 VITALS — BP 108/71 | HR 65 | Temp 97.8°F | Ht 65.0 in | Wt 174.0 lb

## 2020-07-09 DIAGNOSIS — E559 Vitamin D deficiency, unspecified: Secondary | ICD-10-CM | POA: Diagnosis not present

## 2020-07-09 DIAGNOSIS — E7849 Other hyperlipidemia: Secondary | ICD-10-CM | POA: Diagnosis not present

## 2020-07-09 DIAGNOSIS — Z9189 Other specified personal risk factors, not elsewhere classified: Secondary | ICD-10-CM

## 2020-07-09 DIAGNOSIS — Z6834 Body mass index (BMI) 34.0-34.9, adult: Secondary | ICD-10-CM

## 2020-07-09 DIAGNOSIS — E669 Obesity, unspecified: Secondary | ICD-10-CM

## 2020-07-09 DIAGNOSIS — R7303 Prediabetes: Secondary | ICD-10-CM | POA: Diagnosis not present

## 2020-07-09 MED ORDER — VITAMIN D (ERGOCALCIFEROL) 1.25 MG (50000 UNIT) PO CAPS: 50000.0000 [IU] | ORAL_CAPSULE | ORAL | 0 refills | Status: DC

## 2020-07-10 NOTE — Progress Notes (Signed)
Chief Complaint:   OBESITY Ariel Ellis is here to discuss her progress with her obesity treatment plan along with follow-up of her obesity related diagnoses. Ariel Ellis is on keeping a food journal and adhering to recommended goals of 1200-1300 calories and 85 g protein and following a lower carbohydrate, vegetable and lean protein rich diet plan and states she is following her eating plan approximately 95% of the time. Ariel Ellis states she is walking 60 minutes 5 times per week.  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 The market has completed, so she will have more time to dedicate to eating on plan.  Of note: RMR re-checked at last OV- 1761 (increased from previous check of 1544).  Subjective:   1. Vitamin D deficiency Discussed labs with patient today. Ariel Ellis's Vitamin D level was 59.4 on 05/19/2020. She is currently taking prescription vitamin D 50,000 IU each week. She denies nausea, vomiting or muscle weakness.   Ref. Range 05/19/2020 08:49  Vitamin D, 25-Hydroxy Latest Ref Range: 30.0 - 100.0 ng/mL 59.4   2. Pre-diabetes Discussed labs with patient today. Ariel Ellis's 05/19/2020 BG 87, A1c 5.6, and insulin level 4.4. All levels have improved with at goal A1c.  Lab Results  Component Value Date   HGBA1C 5.6 05/19/2020   Lab Results  Component Value Date   INSULIN 4.4 05/19/2020   INSULIN 5.9 12/31/2019   INSULIN 4.3 07/16/2019   INSULIN 26.3 (H) 10/16/2018   INSULIN 10.3 06/12/2018    3. Other hyperlipidemia Discussed labs with patient today. Ariel Ellis's 05/19/2020 lipid panel revealed an increase in total and LDL levels with excellent HDL.  Her mother is on statin therapy.  She denies tobacco/vape use.  The 10-year  ASCVD risk score Denman George DC Montez Hageman., et al., 2013) is: 1%   Values used to calculate the score:     Age: 55 years     Sex: Female     Is Non-Hispanic African American: No     Diabetic: No     Tobacco smoker: No     Systolic Blood Pressure: 108 mmHg     Is BP treated: No     HDL Cholesterol: 79 mg/dL     Total Cholesterol: 214 mg/dL  Lab Results  Component Value Date   ALT 15 05/19/2020   AST 18 05/19/2020   ALKPHOS 84 05/19/2020   BILITOT 0.4 05/19/2020   Lab Results  Component Value Date   CHOL 214 (H) 05/19/2020   HDL 79 05/19/2020   LDLCALC 121 (H) 05/19/2020   TRIG 81 05/19/2020   CHOLHDL 3.5 01/06/2018    4. At risk for osteoporosis Ariel Ellis is at higher risk of osteopenia and osteoporosis due to Vitamin D deficiency and obesity.  Assessment/Plan:   1. Vitamin D deficiency Low Vitamin D level contributes to fatigue and are associated with obesity, breast, and colon cancer. She agrees to continue to take prescription Vitamin D @50 ,000 IU every week and will follow-up for routine testing of Vitamin D, at least 2-3 times per year to avoid over-replacement.  - Vitamin D, Ergocalciferol, (DRISDOL) 1.25 MG (50000 UNIT) CAPS capsule; Take 1 capsule (50,000 Units total) by mouth every 7 (seven) days.  Dispense: 4 capsule;  Refill: 0  2. Pre-diabetes Ariel Ellis will continue to work on weight loss, exercise, and decreasing simple carbohydrates to help decrease the risk of diabetes. Continue regular exercise.  3. Other hyperlipidemia Cardiovascular risk and specific lipid/LDL goals reviewed.  We discussed several lifestyle modifications today and Ariel Ellis will continue to work on diet, exercise and weight loss efforts. Orders and follow up as documented in patient record. Decrease saturated fat and continue regular exercise.  Counseling Intensive lifestyle modifications are the first line treatment for this issue. . Dietary changes: Increase soluble fiber. Decrease simple carbohydrates. . Exercise  changes: Moderate to vigorous-intensity aerobic activity 150 minutes per week if tolerated. . Lipid-lowering medications: see documented in medical record.  4. At risk for osteoporosis Ariel Ellis was given approximately 15 minutes of osteoporosis prevention counseling today. Ariel Ellis is at risk for osteopenia and osteoporosis due to her Vitamin D deficiency. She was encouraged to take her Vitamin D and follow her higher calcium diet and increase strengthening exercise to help strengthen her bones and decrease her risk of osteopenia and osteoporosis.  Repetitive spaced learning was employed today to elicit superior memory formation and behavioral change.  5. Current BMI 29.0 Ariel Ellis is currently in the action stage of change. As such, her goal is to continue with weight loss efforts. She has agreed to keeping a food journal and adhering to recommended goals of 1200-1400 calories and 85 g protein.   Handout: Recipe Guide and Eating Out Guide  Exercise goals: As is  Behavioral modification strategies: increasing lean protein intake, meal planning and cooking strategies and planning for success.  Ariel Ellis has agreed to follow-up with our clinic in 4 weeks. She was informed of the importance of frequent follow-up visits to maximize her success with intensive lifestyle modifications for her multiple health conditions.   Objective:   Blood pressure 108/71, pulse 65, temperature 97.8 F (36.6 C), height 5\' 5"  (1.651 m), weight 174 lb (78.9 kg), last menstrual period 04/05/2013, SpO2 98 %. Body mass index is 28.96 kg/m.  General: Cooperative, alert, well developed, in no acute distress. HEENT: Conjunctivae and lids unremarkable. Cardiovascular: Regular rhythm.  Lungs: Normal work of breathing. Neurologic: No focal deficits.   Lab Results  Component Value Date   CREATININE 0.85 05/19/2020   BUN 16 05/19/2020   NA 145 (H) 05/19/2020   K 4.5 05/19/2020   CL 107 (H) 05/19/2020   CO2 24 05/19/2020   Lab  Results  Component Value Date   ALT 15 05/19/2020   AST 18 05/19/2020   ALKPHOS 84 05/19/2020   BILITOT 0.4 05/19/2020   Lab Results  Component Value Date   HGBA1C 5.6 05/19/2020   HGBA1C 5.9 (H) 12/31/2019   HGBA1C 5.6 07/16/2019   HGBA1C 5.6 03/07/2019   HGBA1C 5.8 (H) 10/16/2018   Lab Results  Component Value Date   INSULIN 4.4 05/19/2020   INSULIN 5.9 12/31/2019   INSULIN 4.3 07/16/2019   INSULIN 26.3 (H) 10/16/2018   INSULIN 10.3 06/12/2018   Lab Results  Component Value Date   TSH 1.340 03/07/2019   Lab Results  Component Value Date   CHOL 214 (H) 05/19/2020   HDL 79 05/19/2020   LDLCALC 121 (H) 05/19/2020   TRIG 81 05/19/2020   CHOLHDL 3.5 01/06/2018   Lab Results  Component Value Date   WBC 5.8 01/06/2018   HGB 13.5 01/06/2018   HCT 41.2 01/06/2018   MCV 86 01/06/2018   PLT 272 01/06/2018    Attestation Statements:  Reviewed by clinician on day of visit: allergies, medications, problem list, medical history, surgical history, family history, social history, and previous encounter notes.  Edmund Hilda, am acting as Energy manager for William Hamburger, NP.  I have reviewed the above documentation for accuracy and completeness, and I agree with the above. -  Gregory Dowe d. Rana Adorno, NP-C

## 2020-08-06 ENCOUNTER — Encounter (INDEPENDENT_AMBULATORY_CARE_PROVIDER_SITE_OTHER): Payer: Self-pay | Admitting: Family Medicine

## 2020-08-06 ENCOUNTER — Other Ambulatory Visit: Payer: Self-pay

## 2020-08-06 ENCOUNTER — Ambulatory Visit (INDEPENDENT_AMBULATORY_CARE_PROVIDER_SITE_OTHER): Payer: BC Managed Care – PPO | Admitting: Family Medicine

## 2020-08-06 VITALS — BP 109/73 | HR 84 | Temp 98.0°F | Ht 65.0 in | Wt 172.0 lb

## 2020-08-06 DIAGNOSIS — E669 Obesity, unspecified: Secondary | ICD-10-CM

## 2020-08-06 DIAGNOSIS — E8881 Metabolic syndrome: Secondary | ICD-10-CM

## 2020-08-06 DIAGNOSIS — E559 Vitamin D deficiency, unspecified: Secondary | ICD-10-CM | POA: Diagnosis not present

## 2020-08-06 DIAGNOSIS — E88819 Insulin resistance, unspecified: Secondary | ICD-10-CM

## 2020-08-06 DIAGNOSIS — Z6834 Body mass index (BMI) 34.0-34.9, adult: Secondary | ICD-10-CM | POA: Diagnosis not present

## 2020-08-06 MED ORDER — VITAMIN D (ERGOCALCIFEROL) 1.25 MG (50000 UNIT) PO CAPS: 50000.0000 [IU] | ORAL_CAPSULE | ORAL | 0 refills | Status: DC

## 2020-08-11 ENCOUNTER — Encounter (INDEPENDENT_AMBULATORY_CARE_PROVIDER_SITE_OTHER): Payer: Self-pay | Admitting: Family Medicine

## 2020-08-11 NOTE — Progress Notes (Signed)
Chief Complaint:   OBESITY Ariel Ellis is here to discuss her progress with her obesity treatment plan along with follow-up of her obesity related diagnoses. Ariel Ellis is on keeping a food journal and adhering to recommended goals of 1200-1400 calories and 85 protein and following a lower carbohydrate, vegetable and lean protein rich diet plan and states she is following her eating plan approximately 95% of the time. Ariel Ellis states she is walking 60 minutes 5 times per week.  Today's visit was #: 45 Starting weight: 217 lbs Starting date: 02/22/2018 Today's weight: 172 lbs Today's date: 08/06/2020 Total lbs lost to date: 45 lbs Total lbs lost since last in-office visit: 2  Interim History: Ariel Ellis has been "super" busy with work. She has not had time to journal. She is traveling some for work (at least monthly). She says hunger is not an issue. She is happy at current weight of 172 lbs (28 BMI), but she wouldn't mind losing a few more pounds.  Subjective:   1. Vitamin D deficiency Vit D is at goal at 59.4. Azile is on weekly prescription Vit D.  2. Insulin resistance Last A1c was 5.6 (down from 5.9).   Lab Results  Component Value Date   INSULIN 4.4 05/19/2020   INSULIN 5.9 12/31/2019   INSULIN 4.3 07/16/2019   INSULIN 26.3 (H) 10/16/2018   INSULIN 10.3 06/12/2018   Lab Results  Component Value Date   HGBA1C 5.6 05/19/2020    Assessment/Plan:   1. Vitamin D deficiency  She agrees to continue to take prescription Vitamin D @50 ,000 IU every week and will follow-up for routine testing of Vitamin D, at least 2-3 times per year to avoid over-replacement.  - Vitamin D, Ergocalciferol, (DRISDOL) 1.25 MG (50000 UNIT) CAPS capsule; Take 1 capsule (50,000 Units total) by mouth every 7 (seven) days.  Dispense: 12 capsule; Refill: 0  2. Insulin resistance Continue with meal plan.  3. Overweight: Current BMI 28.62  Ariel Ellis is currently in the action stage of change. As such, her goal is to maintain  weight for now. She has agreed to following a lower carbohydrate, vegetable and lean protein rich diet plan.   Exercise goals: As is  Behavioral modification strategies: planning for success.  Ariel Ellis has agreed to follow-up with our clinic in 8 weeks.   Objective:   Blood pressure 109/73, pulse 84, temperature 98 F (36.7 C), height 5\' 5"  (1.651 m), weight 172 lb (78 kg), last menstrual period 04/05/2013, SpO2 97 %. Body mass index is 28.62 kg/m.  General: Cooperative, alert, well developed, in no acute distress. HEENT: Conjunctivae and lids unremarkable. Cardiovascular: Regular rhythm.  Lungs: Normal work of breathing. Neurologic: No focal deficits.   Lab Results  Component Value Date   CREATININE 0.85 05/19/2020   BUN 16 05/19/2020   NA 145 (H) 05/19/2020   K 4.5 05/19/2020   CL 107 (H) 05/19/2020   CO2 24 05/19/2020   Lab Results  Component Value Date   ALT 15 05/19/2020   AST 18 05/19/2020   ALKPHOS 84 05/19/2020   BILITOT 0.4 05/19/2020   Lab Results  Component Value Date   HGBA1C 5.6 05/19/2020   HGBA1C 5.9 (H) 12/31/2019   HGBA1C 5.6 07/16/2019   HGBA1C 5.6 03/07/2019   HGBA1C 5.8 (H) 10/16/2018   Lab Results  Component Value Date   INSULIN 4.4 05/19/2020   INSULIN 5.9 12/31/2019   INSULIN 4.3 07/16/2019   INSULIN 26.3 (H) 10/16/2018   INSULIN 10.3 06/12/2018  Lab Results  Component Value Date   TSH 1.340 03/07/2019   Lab Results  Component Value Date   CHOL 214 (H) 05/19/2020   HDL 79 05/19/2020   LDLCALC 121 (H) 05/19/2020   TRIG 81 05/19/2020   CHOLHDL 3.5 01/06/2018   Lab Results  Component Value Date   WBC 5.8 01/06/2018   HGB 13.5 01/06/2018   HCT 41.2 01/06/2018   MCV 86 01/06/2018   PLT 272 01/06/2018    Attestation Statements:   Reviewed by clinician on day of visit: allergies, medications, problem list, medical history, surgical history, family history, social history, and previous encounter notes.  Edmund Hilda, CMA,  am acting as Energy manager for [place], FNP.  I have reviewed the above documentation for accuracy and completeness, and I agree with the above. -  Jesse Sans, FNP

## 2020-10-01 ENCOUNTER — Encounter (INDEPENDENT_AMBULATORY_CARE_PROVIDER_SITE_OTHER): Payer: Self-pay | Admitting: Family Medicine

## 2020-10-01 ENCOUNTER — Ambulatory Visit (INDEPENDENT_AMBULATORY_CARE_PROVIDER_SITE_OTHER): Payer: BC Managed Care – PPO | Admitting: Family Medicine

## 2020-10-01 ENCOUNTER — Other Ambulatory Visit: Payer: Self-pay

## 2020-10-01 VITALS — BP 107/71 | HR 77 | Temp 98.0°F | Ht 65.0 in | Wt 174.0 lb

## 2020-10-01 DIAGNOSIS — E88819 Insulin resistance, unspecified: Secondary | ICD-10-CM

## 2020-10-01 DIAGNOSIS — E8881 Metabolic syndrome: Secondary | ICD-10-CM

## 2020-10-01 DIAGNOSIS — Z6834 Body mass index (BMI) 34.0-34.9, adult: Secondary | ICD-10-CM | POA: Diagnosis not present

## 2020-10-01 DIAGNOSIS — E669 Obesity, unspecified: Secondary | ICD-10-CM

## 2020-10-01 DIAGNOSIS — E559 Vitamin D deficiency, unspecified: Secondary | ICD-10-CM | POA: Diagnosis not present

## 2020-10-01 MED ORDER — VITAMIN D (ERGOCALCIFEROL) 1.25 MG (50000 UNIT) PO CAPS: 50000.0000 [IU] | ORAL_CAPSULE | ORAL | 0 refills | Status: DC

## 2020-10-07 ENCOUNTER — Encounter (INDEPENDENT_AMBULATORY_CARE_PROVIDER_SITE_OTHER): Payer: Self-pay | Admitting: Family Medicine

## 2020-10-07 NOTE — Progress Notes (Signed)
Chief Complaint:   OBESITY Mattilynn is here to discuss her progress with her obesity treatment plan along with follow-up of her obesity related diagnoses. Sneha is on following a lower carbohydrate, vegetable and lean protein rich diet plan and states she is following her eating plan approximately 95% of the time. Georgian states she is walking 60 minutes 5 times per week.  Today's visit was #: 46 Starting weight: 217 lbs Starting date: 02/22/2018 Today's weight: 174 lbs Today's date: 10/01/2020 Total lbs lost to date: 43 lbs Total lbs lost since last in-office visit: +2  Interim History: Shaelee has had a few work trips and has been working a lot. She is a few lbs above her goal of 168-172 lbs. She denies excessive hunger and states she is rarely hungry.  Resistance training has dropped off the past few weeks. Incline on treadmill gives her shin splints.  Subjective:   1. Vitamin D deficiency Leighann's Vit D is at goal (59.4). She is on weekly prescription Vit D.   Lab Results  Component Value Date   VD25OH 59.4 05/19/2020   VD25OH 56.3 12/31/2019   VD25OH 47.0 07/16/2019   2. Insulin resistance Darriana's last A1c was 5.6. She was previously in pre-diabetes range.   Lab Results  Component Value Date   INSULIN 4.4 05/19/2020   INSULIN 5.9 12/31/2019   INSULIN 4.3 07/16/2019   INSULIN 26.3 (H) 10/16/2018   INSULIN 10.3 06/12/2018   Lab Results  Component Value Date   HGBA1C 5.6 05/19/2020   Assessment/Plan:   1. Vitamin D deficiency We will refill prescription Vit D 50,000 IU for a 3 month supply.  - Vitamin D, Ergocalciferol, (DRISDOL) 1.25 MG (50000 UNIT) CAPS capsule; Take 1 capsule (50,000 Units total) by mouth every 7 (seven) days.  Dispense: 12 capsule; Refill: 0  2. Insulin resistance Mardene will continue meal plan and exercise.  3. Overweight: Current BMI 28.96  Carianne is currently in the action stage of change. As such, her goal is to maintain weight for now. She has agreed to  following a lower carbohydrate, vegetable and lean protein rich diet plan.   Continue weight maintenance.  Exercise goals:  As is- Add back and resistance training.  Behavioral modification strategies: meal planning and cooking strategies, travel eating strategies, and planning for success.  Roshana has agreed to follow-up with our clinic in 8 weeks.  Objective:   Blood pressure 107/71, pulse 77, temperature 98 F (36.7 C), height 5\' 5"  (1.651 m), weight 174 lb (78.9 kg), last menstrual period 04/05/2013, SpO2 97 %. Body mass index is 28.96 kg/m.  General: Cooperative, alert, well developed, in no acute distress. HEENT: Conjunctivae and lids unremarkable. Cardiovascular: Regular rhythm.  Lungs: Normal work of breathing. Neurologic: No focal deficits.   Lab Results  Component Value Date   CREATININE 0.85 05/19/2020   BUN 16 05/19/2020   NA 145 (H) 05/19/2020   K 4.5 05/19/2020   CL 107 (H) 05/19/2020   CO2 24 05/19/2020   Lab Results  Component Value Date   ALT 15 05/19/2020   AST 18 05/19/2020   ALKPHOS 84 05/19/2020   BILITOT 0.4 05/19/2020   Lab Results  Component Value Date   HGBA1C 5.6 05/19/2020   HGBA1C 5.9 (H) 12/31/2019   HGBA1C 5.6 07/16/2019   HGBA1C 5.6 03/07/2019   HGBA1C 5.8 (H) 10/16/2018   Lab Results  Component Value Date   INSULIN 4.4 05/19/2020   INSULIN 5.9 12/31/2019  INSULIN 4.3 07/16/2019   INSULIN 26.3 (H) 10/16/2018   INSULIN 10.3 06/12/2018   Lab Results  Component Value Date   TSH 1.340 03/07/2019   Lab Results  Component Value Date   CHOL 214 (H) 05/19/2020   HDL 79 05/19/2020   LDLCALC 121 (H) 05/19/2020   TRIG 81 05/19/2020   CHOLHDL 3.5 01/06/2018   Lab Results  Component Value Date   VD25OH 59.4 05/19/2020   VD25OH 56.3 12/31/2019   VD25OH 47.0 07/16/2019   Lab Results  Component Value Date   WBC 5.8 01/06/2018   HGB 13.5 01/06/2018   HCT 41.2 01/06/2018   MCV 86 01/06/2018   PLT 272 01/06/2018    Attestation  Statements:   Reviewed by clinician on day of visit: allergies, medications, problem list, medical history, surgical history, family history, social history, and previous encounter notes.  Edmund Hilda, CMA, am acting as Energy manager for [place], FNP.  I have reviewed the above documentation for accuracy and completeness, and I agree with the above. -  Jesse Sans, FNP

## 2020-11-25 ENCOUNTER — Other Ambulatory Visit: Payer: Self-pay

## 2020-11-25 ENCOUNTER — Encounter (INDEPENDENT_AMBULATORY_CARE_PROVIDER_SITE_OTHER): Payer: Self-pay | Admitting: Family Medicine

## 2020-11-25 ENCOUNTER — Ambulatory Visit (INDEPENDENT_AMBULATORY_CARE_PROVIDER_SITE_OTHER): Payer: BC Managed Care – PPO | Admitting: Family Medicine

## 2020-11-25 VITALS — BP 116/67 | HR 74 | Temp 98.4°F | Ht 65.0 in | Wt 176.0 lb

## 2020-11-25 DIAGNOSIS — E559 Vitamin D deficiency, unspecified: Secondary | ICD-10-CM

## 2020-11-25 DIAGNOSIS — E7849 Other hyperlipidemia: Secondary | ICD-10-CM | POA: Diagnosis not present

## 2020-11-25 DIAGNOSIS — E669 Obesity, unspecified: Secondary | ICD-10-CM | POA: Diagnosis not present

## 2020-11-25 DIAGNOSIS — Z6834 Body mass index (BMI) 34.0-34.9, adult: Secondary | ICD-10-CM

## 2020-11-25 DIAGNOSIS — R7303 Prediabetes: Secondary | ICD-10-CM

## 2020-11-25 MED ORDER — VITAMIN D (ERGOCALCIFEROL) 1.25 MG (50000 UNIT) PO CAPS: 50000.0000 [IU] | ORAL_CAPSULE | ORAL | 0 refills | Status: DC

## 2020-11-25 NOTE — Progress Notes (Signed)
Chief Complaint:   OBESITY Ariel Ellis is here to discuss her progress with her obesity treatment plan along with follow-up of her obesity related diagnoses. Ariel Ellis is on following a lower carbohydrate, vegetable and lean protein rich diet plan and states she is following her eating plan approximately 80% of the time. Ariel Ellis states she is walking for 60 minutes 5 times per week.  Today's visit was #: 47 Starting weight: 217 lbs Starting date: 02/22/2018 Today's weight: 176 lbs Today's date:11/25/2020 Total lbs lost to date: 41 lbs Total lbs lost since last in-office visit: 0  Interim History: Ariel Ellis has had 3 recent business trips and a week of vacation and is not surprised with 2 lb weight gain. Her goal is 168-172 lbs so she is 4 lbs above her goal weight. She follows low carb mostly with some fruit. She has been consistent with resistance training. Her appetite is well controlled.  Subjective:   1. Prediabetes Ariel Ellis's last A1C level was 5.6. She is currently not on Metformin.  Lab Results  Component Value Date   HGBA1C 5.6 05/19/2020   Lab Results  Component Value Date   INSULIN 4.4 05/19/2020   INSULIN 5.9 12/31/2019   INSULIN 4.3 07/16/2019   INSULIN 26.3 (H) 10/16/2018   INSULIN 10.3 06/12/2018    2. Vitamin D deficiency Ariel Ellis's Vitamin D at goal 59.4. She is on weekly prescription Vitamin D.  Lab Results  Component Value Date   VD25OH 59.4 05/19/2020   VD25OH 56.3 12/31/2019   VD25OH 47.0 07/16/2019    3. Other hyperlipidemia Ariel Ellis's last LDL was elevated at 121. Her HDL and Triglycerides were within normal limits. She is currently not on medications.   Lab Results  Component Value Date   CHOL 214 (H) 05/19/2020   HDL 79 05/19/2020   LDLCALC 121 (H) 05/19/2020   TRIG 81 05/19/2020   CHOLHDL 3.5 01/06/2018   Lab Results  Component Value Date   ALT 15 05/19/2020   AST 18 05/19/2020   ALKPHOS 84 05/19/2020   BILITOT 0.4 05/19/2020   The 10-year ASCVD risk score Ariel Ellis  DC Jr., et al., 2013) is: 1.2%   Values used to calculate the score:     Age: 55 years     Sex: Female     Is Non-Hispanic African American: No     Diabetic: No     Tobacco smoker: No     Systolic Blood Pressure: 116 mmHg     Is BP treated: No     HDL Cholesterol: 82 mg/dL     Total Cholesterol: 236 mg/dL   Assessment/Plan:   1. Prediabetes We will check A1C today. Atonya will continue to work on weight loss, exercise, and decreasing simple carbohydrates to help decrease the risk of diabetes.   - Comprehensive metabolic panel - Hemoglobin A1c - Insulin, random  2. Vitamin D deficiency  We will refill prescription Vitamin D 50,000 IU every week and Ariel Ellis will follow-up for routine testing of Vitamin D, at least 2-3 times per year to avoid over-replacement.  - VITAMIN D 25 Hydroxy (Vit-D Deficiency, Fractures) - Vitamin D, Ergocalciferol, (DRISDOL) 1.25 MG (50000 UNIT) CAPS capsule; Take 1 capsule (50,000 Units total) by mouth every 7 (seven) days.  Dispense: 12 capsule; Refill: 0  3. Other hyperlipidemia Cardiovascular risk and specific lipid/LDL goals reviewed.  We discussed several lifestyle modifications today and Mertis will continue to work on diet, exercise and weight loss efforts. We will check FLP today.   -  Lipid Panel With LDL/HDL Ratio  4. Overweight: Current BMI 29.29 Chance is currently in the action stage of change. As such, her goal is to continue with weight loss efforts. She has agreed to following a lower carbohydrate, vegetable and lean protein rich diet plan with fruit.  Exercise goals:  As is.  Behavioral modification strategies: decreasing simple carbohydrates.  Ariel Ellis has agreed to follow-up with our clinic in 8 weeks.  Ariel Ellis was informed we would discuss her lab results at her next visit unless there is a critical issue that needs to be addressed sooner. Ariel Ellis agreed to keep her next visit at the agreed upon time to discuss these results.  Objective:   Blood pressure  116/67, pulse 74, temperature 98.4 F (36.9 C), temperature source Oral, height 5\' 5"  (1.651 m), weight 176 lb (79.8 kg), last menstrual period 04/05/2013, SpO2 92 %. Body mass index is 29.29 kg/m.  General: Cooperative, alert, well developed, in no acute distress. HEENT: Conjunctivae and lids unremarkable. Cardiovascular: Regular rhythm.  Lungs: Normal work of breathing. Neurologic: No focal deficits.   Lab Results  Component Value Date   CREATININE 0.85 05/19/2020   BUN 16 05/19/2020   NA 145 (H) 05/19/2020   K 4.5 05/19/2020   CL 107 (H) 05/19/2020   CO2 24 05/19/2020   Lab Results  Component Value Date   ALT 15 05/19/2020   AST 18 05/19/2020   ALKPHOS 84 05/19/2020   BILITOT 0.4 05/19/2020   Lab Results  Component Value Date   HGBA1C 5.6 05/19/2020   HGBA1C 5.9 (H) 12/31/2019   HGBA1C 5.6 07/16/2019   HGBA1C 5.6 03/07/2019   HGBA1C 5.8 (H) 10/16/2018   Lab Results  Component Value Date   INSULIN 4.4 05/19/2020   INSULIN 5.9 12/31/2019   INSULIN 4.3 07/16/2019   INSULIN 26.3 (H) 10/16/2018   INSULIN 10.3 06/12/2018   Lab Results  Component Value Date   TSH 1.340 03/07/2019   Lab Results  Component Value Date   CHOL 214 (H) 05/19/2020   HDL 79 05/19/2020   LDLCALC 121 (H) 05/19/2020   TRIG 81 05/19/2020   CHOLHDL 3.5 01/06/2018   Lab Results  Component Value Date   VD25OH 59.4 05/19/2020   VD25OH 56.3 12/31/2019   VD25OH 47.0 07/16/2019   Lab Results  Component Value Date   WBC 5.8 01/06/2018   HGB 13.5 01/06/2018   HCT 41.2 01/06/2018   MCV 86 01/06/2018   PLT 272 01/06/2018   No results found for: IRON, TIBC, FERRITIN  Attestation Statements:   Reviewed by clinician on day of visit: allergies, medications, problem list, medical history, surgical history, family history, social history, and previous encounter notes.  I, 03/08/2018, RMA, am acting as [place] for Energy manager, FNP.   I have reviewed the above documentation for  accuracy and completeness, and I agree with the above. -  [place], FNP

## 2020-11-26 ENCOUNTER — Encounter (INDEPENDENT_AMBULATORY_CARE_PROVIDER_SITE_OTHER): Payer: Self-pay | Admitting: Family Medicine

## 2020-11-26 LAB — COMPREHENSIVE METABOLIC PANEL
ALT: 16 IU/L (ref 0–32)
AST: 17 IU/L (ref 0–40)
Albumin/Globulin Ratio: 2.3 — ABNORMAL HIGH (ref 1.2–2.2)
Albumin: 4.5 g/dL (ref 3.8–4.9)
Alkaline Phosphatase: 85 IU/L (ref 44–121)
BUN/Creatinine Ratio: 20 (ref 9–23)
BUN: 17 mg/dL (ref 6–24)
Bilirubin Total: 0.5 mg/dL (ref 0.0–1.2)
CO2: 25 mmol/L (ref 20–29)
Calcium: 9.2 mg/dL (ref 8.7–10.2)
Chloride: 104 mmol/L (ref 96–106)
Creatinine, Ser: 0.83 mg/dL (ref 0.57–1.00)
Globulin, Total: 2 g/dL (ref 1.5–4.5)
Glucose: 90 mg/dL (ref 65–99)
Potassium: 4.3 mmol/L (ref 3.5–5.2)
Sodium: 142 mmol/L (ref 134–144)
Total Protein: 6.5 g/dL (ref 6.0–8.5)
eGFR: 84 mL/min/{1.73_m2} (ref >59)

## 2020-11-26 LAB — HEMOGLOBIN A1C
Est. average glucose Bld gHb Est-mCnc: 117 mg/dL
Hgb A1c MFr Bld: 5.7 % — ABNORMAL HIGH (ref 4.8–5.6)

## 2020-11-26 LAB — LIPID PANEL WITH LDL/HDL RATIO
Cholesterol, Total: 236 mg/dL — ABNORMAL HIGH (ref 100–199)
HDL: 82 mg/dL (ref >39)
LDL Chol Calc (NIH): 143 mg/dL — ABNORMAL HIGH (ref 0–99)
LDL/HDL Ratio: 1.7 ratio (ref 0.0–3.2)
Triglycerides: 63 mg/dL (ref 0–149)
VLDL Cholesterol Cal: 11 mg/dL (ref 5–40)

## 2020-11-26 LAB — VITAMIN D 25 HYDROXY (VIT D DEFICIENCY, FRACTURES): Vit D, 25-Hydroxy: 57.8 ng/mL (ref 30.0–100.0)

## 2020-11-26 LAB — INSULIN, RANDOM: INSULIN: 7.1 u[IU]/mL (ref 2.6–24.9)

## 2021-01-20 ENCOUNTER — Other Ambulatory Visit: Payer: Self-pay

## 2021-01-20 ENCOUNTER — Encounter (INDEPENDENT_AMBULATORY_CARE_PROVIDER_SITE_OTHER): Payer: Self-pay | Admitting: Adult Health

## 2021-01-20 ENCOUNTER — Ambulatory Visit (INDEPENDENT_AMBULATORY_CARE_PROVIDER_SITE_OTHER): Payer: BC Managed Care – PPO | Admitting: Adult Health

## 2021-01-20 VITALS — BP 107/72 | HR 72 | Temp 98.2°F | Ht 65.0 in | Wt 175.0 lb

## 2021-01-20 DIAGNOSIS — E7849 Other hyperlipidemia: Secondary | ICD-10-CM | POA: Diagnosis not present

## 2021-01-20 DIAGNOSIS — E669 Obesity, unspecified: Secondary | ICD-10-CM

## 2021-01-20 DIAGNOSIS — E559 Vitamin D deficiency, unspecified: Secondary | ICD-10-CM | POA: Diagnosis not present

## 2021-01-20 DIAGNOSIS — R7303 Prediabetes: Secondary | ICD-10-CM

## 2021-01-20 DIAGNOSIS — Z9189 Other specified personal risk factors, not elsewhere classified: Secondary | ICD-10-CM | POA: Diagnosis not present

## 2021-01-20 DIAGNOSIS — Z6834 Body mass index (BMI) 34.0-34.9, adult: Secondary | ICD-10-CM

## 2021-01-20 MED ORDER — METFORMIN HCL 500 MG PO TABS: ORAL_TABLET | ORAL | 0 refills | Status: DC

## 2021-01-20 MED ORDER — METFORMIN HCL 500 MG PO TABS: ORAL_TABLET | ORAL | 3 refills | Status: DC

## 2021-01-20 NOTE — Progress Notes (Signed)
Chief Complaint:   OBESITY Ariel Ellis is here to discuss her progress with her obesity treatment plan along with follow-up of her obesity related diagnoses. Ariel Ellis is on following a lower carbohydrate, vegetable and lean protein rich diet plan and states she is following her eating plan approximately 95% of the time. Ariel Ellis states she is walking and weight training 60 minutes 5 times per week.  Today's visit was #: 48 Starting weight: 217 lbs Starting date: 02/22/2018 Today's weight: 175 lbs Today's date: 01/20/2021 Total lbs lost to date: 42 Total lbs lost since last in-office visit: 1  Interim History:Her typical daily intake as follows: Breakfast- 2 eggs with unsweetened tea Lunch- Minestrone soup with pasta- chicken/beans in soup Dinner- protein with vegetables.  Subjective:   1. Pre-diabetes Worsening. Discussed labs with patient today. 11/25/2020 BG 90, A1c 5.7, and insulin level 7.1. Ariel Ellis denies family history of T2D Her A1c has remained in pre-diabetic range, despite over 42 lbs of weight loss.  2. Vitamin D deficiency Discussed labs with patient today. 11/25/2020 Vit D level was 57.8 - at goal.   3. Other hyperlipidemia Worsening. Discussed labs with patient today. 11/25/2020 Lipid panel- Total and LDL are both worsened. Ariel Ellis is not on statin therapy. She denies family history of HLD.  ASCVD risk stratification 1.1%.  4. At risk for diarrhea Ariel Ellis is at higher than average risk for developing diabetes due to starting Metformin for continued pre-diabetes.  Assessment/Plan:   1. Pre-diabetes Ariel Ellis will continue to work on weight loss, exercise, and decreasing simple carbohydrates to help decrease the risk of diabetes. Pt will start Metformin 500 mg as directed.   Start- metFORMIN (GLUCOPHAGE) 500 MG tablet; 1/2 tab with breakfast for one week, then increase to 1 full tab with breakfast- hold at this dose.  Dispense: 30 tablet; Refill: 0  2. Vitamin D deficiency Low Vitamin D  level contributes to fatigue and are associated with obesity, breast, and colon cancer. She agrees to convert from Ergocalciferol to OTC Vit D3 2,000 IU QD and will follow-up for routine testing of Vitamin D, at least 2-3 times per year to avoid over-replacement.  3. Other hyperlipidemia Cardiovascular risk and specific lipid/LDL goals reviewed.  We discussed several lifestyle modifications today and Ariel Ellis will continue to work on diet, exercise and weight loss efforts. Orders and follow up as documented in patient record. Decrease saturated fat. Monitor labs.  Counseling Intensive lifestyle modifications are the first line treatment for this issue. Dietary changes: Increase soluble fiber. Decrease simple carbohydrates. Exercise changes: Moderate to vigorous-intensity aerobic activity 150 minutes per week if tolerated. Lipid-lowering medications: see documented in medical record.  4. At risk for diarrhea Ariel Ellis was given approximately 15 minutes of diabetes education and counseling today. We discussed intensive lifestyle modifications today with an emphasis on weight loss as well as increasing exercise and decreasing simple carbohydrates in her diet. We also reviewed medication options with an emphasis on risk versus benefit of those discussed.   Repetitive spaced learning was employed today to elicit superior memory formation and behavioral change.  5. Overweight: Current BMI 29.2  Ariel Ellis is currently in the action stage of change. As such, her goal is to continue with weight loss efforts. She has agreed to following a lower carbohydrate, vegetable and lean protein rich diet plan.   Handout: Metformin Sheet; Pre-diabetes  Exercise goals:  As is  Behavioral modification strategies: increasing lean protein intake, decreasing simple carbohydrates, meal planning and cooking strategies, keeping healthy  foods in the home, and planning for success.  Ariel Ellis has agreed to follow-up with our clinic in 4 weeks.  She was informed of the importance of frequent follow-up visits to maximize her success with intensive lifestyle modifications for her multiple health conditions.   Objective:   Blood pressure 107/72, pulse 72, temperature 98.2 F (36.8 C), height 5\' 5"  (1.651 m), weight 175 lb (79.4 kg), last menstrual period 04/05/2013, SpO2 97 %. Body mass index is 29.12 kg/m.  General: Cooperative, alert, well developed, in no acute distress. HEENT: Conjunctivae and lids unremarkable. Cardiovascular: Regular rhythm.  Lungs: Normal work of breathing. Neurologic: No focal deficits.   Lab Results  Component Value Date   CREATININE 0.83 11/25/2020   BUN 17 11/25/2020   NA 142 11/25/2020   K 4.3 11/25/2020   CL 104 11/25/2020   CO2 25 11/25/2020   Lab Results  Component Value Date   ALT 16 11/25/2020   AST 17 11/25/2020   ALKPHOS 85 11/25/2020   BILITOT 0.5 11/25/2020   Lab Results  Component Value Date   HGBA1C 5.7 (H) 11/25/2020   HGBA1C 5.6 05/19/2020   HGBA1C 5.9 (H) 12/31/2019   HGBA1C 5.6 07/16/2019   HGBA1C 5.6 03/07/2019   Lab Results  Component Value Date   INSULIN 7.1 11/25/2020   INSULIN 4.4 05/19/2020   INSULIN 5.9 12/31/2019   INSULIN 4.3 07/16/2019   INSULIN 26.3 (H) 10/16/2018   Lab Results  Component Value Date   TSH 1.340 03/07/2019   Lab Results  Component Value Date   CHOL 236 (H) 11/25/2020   HDL 82 11/25/2020   LDLCALC 143 (H) 11/25/2020   TRIG 63 11/25/2020   CHOLHDL 3.5 01/06/2018   Lab Results  Component Value Date   VD25OH 57.8 11/25/2020   VD25OH 59.4 05/19/2020   VD25OH 56.3 12/31/2019   Lab Results  Component Value Date   WBC 5.8 01/06/2018   HGB 13.5 01/06/2018   HCT 41.2 01/06/2018   MCV 86 01/06/2018   PLT 272 01/06/2018    Attestation Statements:   Reviewed by clinician on day of visit: allergies, medications, problem list, medical history, surgical history, family history, social history, and previous encounter notes.  03/08/2018, CMA, am acting as transcriptionist for Edmund Hilda, NP.  I have reviewed the above documentation for accuracy and completeness, and I agree with the above. -  Jarquavious Fentress d. Dorthey Depace, NP-C
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
## 2021-01-29 ENCOUNTER — Other Ambulatory Visit: Payer: Self-pay

## 2021-01-29 ENCOUNTER — Ambulatory Visit (INDEPENDENT_AMBULATORY_CARE_PROVIDER_SITE_OTHER): Payer: BC Managed Care – PPO | Admitting: Obstetrics and Gynecology

## 2021-01-29 ENCOUNTER — Encounter: Payer: Self-pay | Admitting: Obstetrics and Gynecology

## 2021-01-29 ENCOUNTER — Ambulatory Visit: Payer: 59 | Admitting: Obstetrics and Gynecology

## 2021-01-29 VITALS — BP 120/84 | HR 65 | Ht 65.0 in | Wt 178.0 lb

## 2021-01-29 DIAGNOSIS — Z01419 Encounter for gynecological examination (general) (routine) without abnormal findings: Secondary | ICD-10-CM

## 2021-01-29 DIAGNOSIS — R350 Frequency of micturition: Secondary | ICD-10-CM | POA: Diagnosis not present

## 2021-01-29 NOTE — Patient Instructions (Signed)

## 2021-02-02 LAB — URINALYSIS W MICROSCOPIC + REFLEX CULTURE
Bilirubin Urine: NEGATIVE
Glucose, UA: NEGATIVE
Hgb urine dipstick: NEGATIVE
Hyaline Cast: NONE SEEN /LPF
Ketones, ur: NEGATIVE
Nitrites, Initial: NEGATIVE
Protein, ur: NEGATIVE
RBC / HPF: NONE SEEN /HPF (ref 0–2)
Specific Gravity, Urine: 1.01 (ref 1.001–1.035)
pH: 6 (ref 5.0–8.0)

## 2021-02-02 LAB — CULTURE INDICATED

## 2021-02-02 LAB — URINE CULTURE
MICRO NUMBER:: 12560048
SPECIMEN QUALITY:: ADEQUATE

## 2021-02-04 ENCOUNTER — Other Ambulatory Visit: Payer: Self-pay

## 2021-02-04 MED ORDER — SULFAMETHOXAZOLE-TRIMETHOPRIM 800-160 MG PO TABS: 1.0000 | ORAL_TABLET | Freq: Two times a day (BID) | ORAL | 0 refills | Status: DC

## 2021-02-16 ENCOUNTER — Encounter (INDEPENDENT_AMBULATORY_CARE_PROVIDER_SITE_OTHER): Payer: Self-pay

## 2021-02-17 ENCOUNTER — Ambulatory Visit (INDEPENDENT_AMBULATORY_CARE_PROVIDER_SITE_OTHER): Payer: BC Managed Care – PPO | Admitting: Adult Health

## 2021-03-18 ENCOUNTER — Other Ambulatory Visit: Payer: Self-pay

## 2021-03-18 ENCOUNTER — Ambulatory Visit (INDEPENDENT_AMBULATORY_CARE_PROVIDER_SITE_OTHER): Payer: BC Managed Care – PPO | Admitting: Adult Health

## 2021-03-18 ENCOUNTER — Encounter (INDEPENDENT_AMBULATORY_CARE_PROVIDER_SITE_OTHER): Payer: Self-pay | Admitting: Adult Health

## 2021-03-18 VITALS — BP 119/73 | HR 82 | Temp 98.3°F | Ht 65.0 in | Wt 177.0 lb

## 2021-03-18 DIAGNOSIS — E559 Vitamin D deficiency, unspecified: Secondary | ICD-10-CM

## 2021-03-18 DIAGNOSIS — Z6834 Body mass index (BMI) 34.0-34.9, adult: Secondary | ICD-10-CM

## 2021-03-18 DIAGNOSIS — E669 Obesity, unspecified: Secondary | ICD-10-CM

## 2021-03-18 DIAGNOSIS — R7303 Prediabetes: Secondary | ICD-10-CM | POA: Diagnosis not present

## 2021-03-18 NOTE — Progress Notes (Signed)
Chief Complaint:   OBESITY Ariel Ellis is here to discuss her progress with her obesity treatment plan along with follow-up of her obesity related diagnoses. Ariel Ellis is on following a lower carbohydrate, vegetable and lean protein rich diet plan and states she is following her eating plan approximately 95% of the time. Ariel Ellis states she is walking/doing cardio/lifting weights/doing squats for 60 minutes 5 times per week.  Today's visit was #: 49 Starting weight: 217 lbs Starting date: 02/22/2018 Today's weight: 177 lbs Today's date: 03/18/2021 Total lbs lost to date: 40 lbs Total lbs lost since last in-office visit: 0  Interim History:  Ariel Ellis is pleased to have essentially maintained her weight since her last OV Thanksgiving and a work trip to [place], [place]. She says she never started metformin - preferred to increase activity to normalize glucose levels.  Subjective:   1. Prediabetes She did not start metformin; increased walking to 3-4 miles. She denies polyphagia.  2. Vitamin D deficiency She was converted from ergocalciferol to OTC vitamin D3 2,000 IU daily due to therapeutic Vit D level- 57.8 on 11/25/20.  Assessment/Plan:   1. Prediabetes Monitor labs.  2. Vitamin D deficiency Continue OTC supplement.  3. Overweight: Current BMI 29.5  Ariel Ellis is currently in the action stage of change. As such, her goal is to continue with weight loss efforts. She has agreed to following a lower carbohydrate, vegetable and lean protein rich diet plan.   Exercise goals:  As is.  Behavioral modification strategies: increasing lean protein intake, decreasing simple carbohydrates, meal planning and cooking strategies, keeping healthy foods in the home, and planning for success.  Ariel Ellis has agreed to follow-up with our clinic in 6 weeks. She was informed of the importance of frequent follow-up visits to maximize her success with intensive lifestyle modifications for her multiple health conditions.    Objective:   Blood pressure 119/73, pulse 82, temperature 98.3 F (36.8 C), height 5\' 5"  (1.651 m), weight 177 lb (80.3 kg), last menstrual period 04/05/2013, SpO2 97 %. Body mass index is 29.45 kg/m.  General: Cooperative, alert, well developed, in no acute distress. HEENT: Conjunctivae and lids unremarkable. Cardiovascular: Regular rhythm.  Lungs: Normal work of breathing. Neurologic: No focal deficits.   Lab Results  Component Value Date   CREATININE 0.83 11/25/2020   BUN 17 11/25/2020   NA 142 11/25/2020   K 4.3 11/25/2020   CL 104 11/25/2020   CO2 25 11/25/2020   Lab Results  Component Value Date   ALT 16 11/25/2020   AST 17 11/25/2020   ALKPHOS 85 11/25/2020   BILITOT 0.5 11/25/2020   Lab Results  Component Value Date   HGBA1C 5.7 (H) 11/25/2020   HGBA1C 5.6 05/19/2020   HGBA1C 5.9 (H) 12/31/2019   HGBA1C 5.6 07/16/2019   HGBA1C 5.6 03/07/2019   Lab Results  Component Value Date   INSULIN 7.1 11/25/2020   INSULIN 4.4 05/19/2020   INSULIN 5.9 12/31/2019   INSULIN 4.3 07/16/2019   INSULIN 26.3 (H) 10/16/2018   Lab Results  Component Value Date   TSH 1.340 03/07/2019   Lab Results  Component Value Date   CHOL 236 (H) 11/25/2020   HDL 82 11/25/2020   LDLCALC 143 (H) 11/25/2020   TRIG 63 11/25/2020   CHOLHDL 3.5 01/06/2018   Lab Results  Component Value Date   VD25OH 57.8 11/25/2020   VD25OH 59.4 05/19/2020   VD25OH 56.3 12/31/2019   Lab Results  Component Value Date   WBC  5.8 01/06/2018   HGB 13.5 01/06/2018   HCT 41.2 01/06/2018   MCV 86 01/06/2018   PLT 272 01/06/2018   Attestation Statements:   Reviewed by clinician on day of visit: allergies, medications, problem list, medical history, surgical history, family history, social history, and previous encounter notes.  Time spent on visit including pre-visit chart review and post-visit care and charting was 28 minutes.   I, Ariel Ellis, Ariel Ellis, am acting as Energy manager for William Hamburger, NP.  I have reviewed the above documentation for accuracy and completeness, and I agree with the above. - Stacey Maura d. Rasean Joos, NP-C

## 2021-04-09 IMAGING — US US BREAST*R* LIMITED INC AXILLA
1 series · 10 of 10 positions shown · non-contrast
Comparison: Previous exam(s).

CLINICAL DATA: Possible asymmetry in the posterior, inferior right
breast in the oblique projection of a recent 2D screening mammogram.

EXAM:
DIGITAL DIAGNOSTIC RIGHT MAMMOGRAM WITH CAD AND TOMO
ULTRASOUND RIGHT BREAST

[Series 1: us breast*right* limited inc axilla · 0.06mm/px · 10 of 10 slices shown]
[im 1/10]
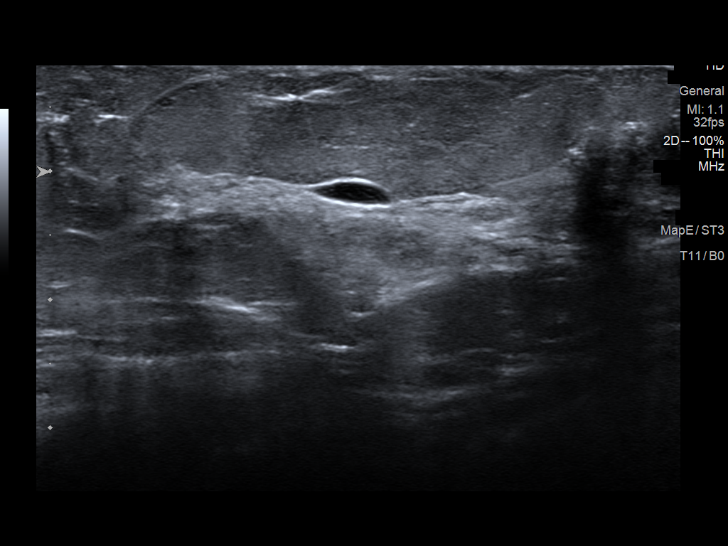
[im 2/10]
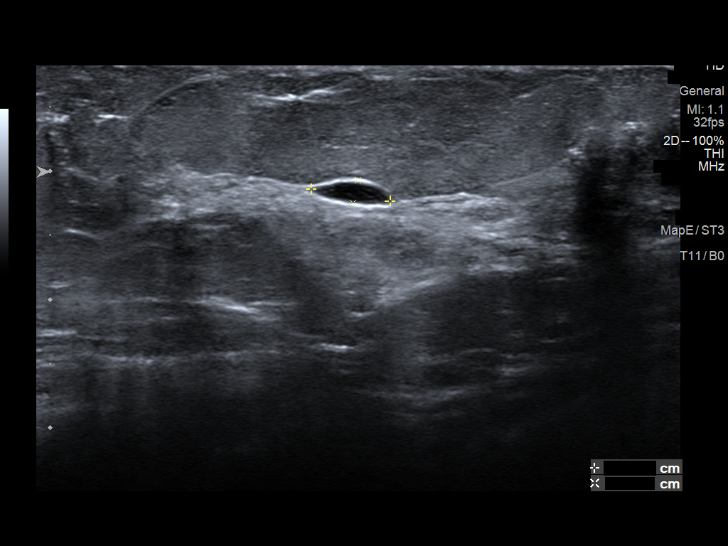
[im 3/10]
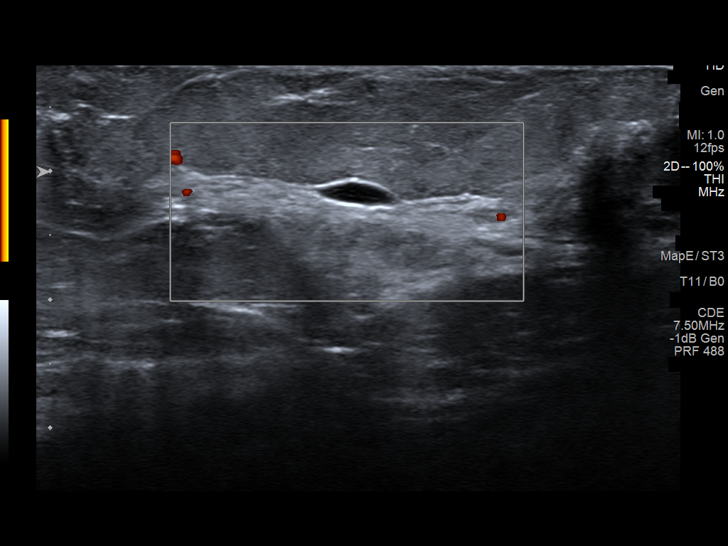
[im 4/10]
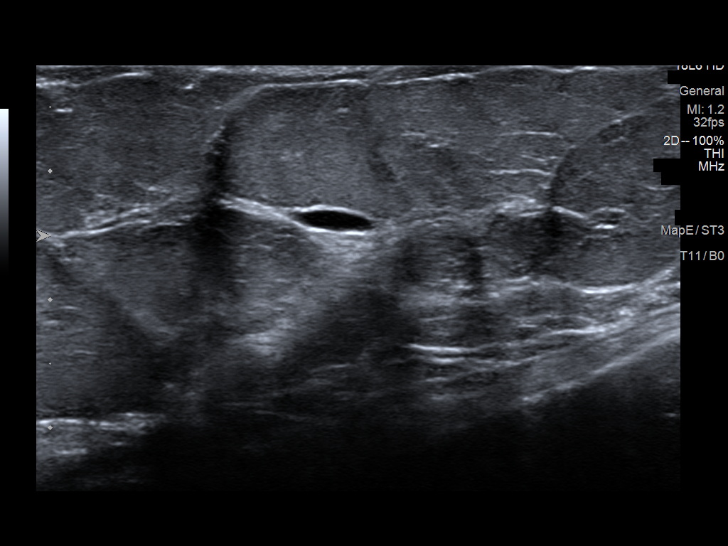
[im 5/10]
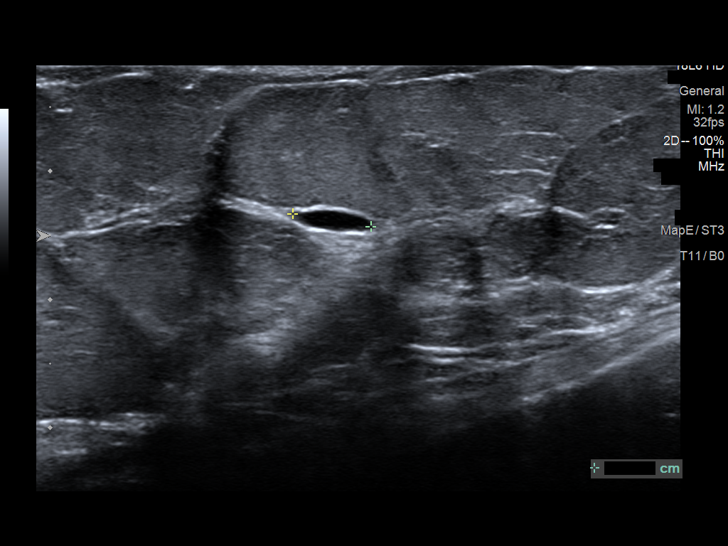
[im 6/10]
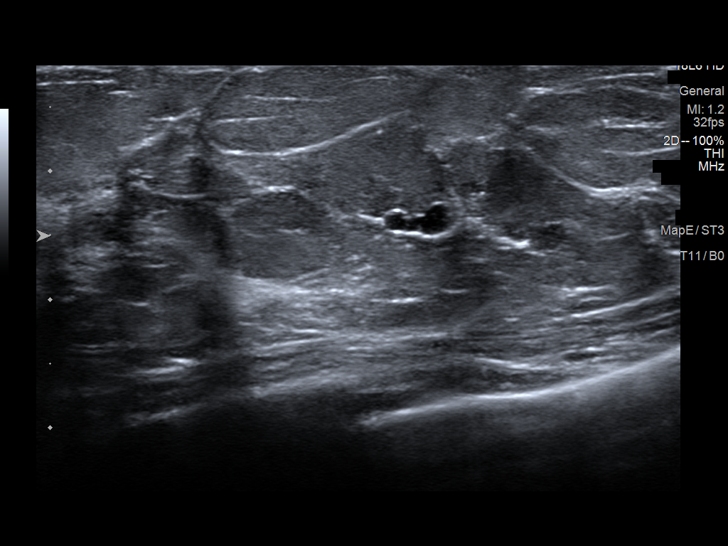
[im 7/10]
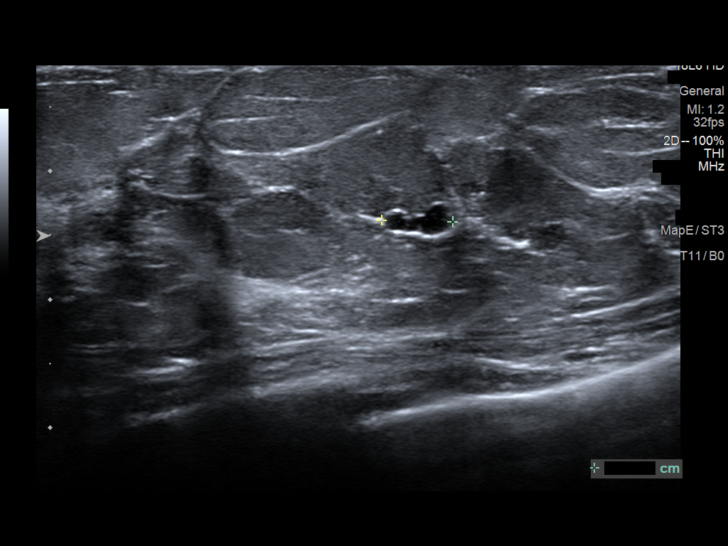
[im 8/10]
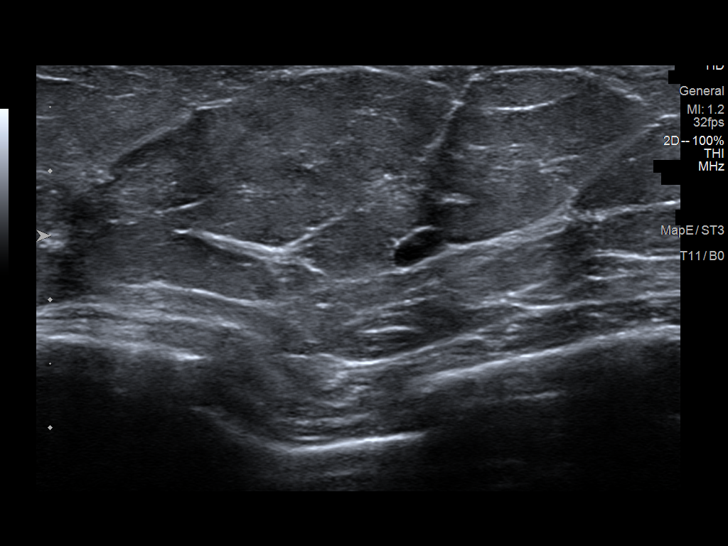
[im 9/10]
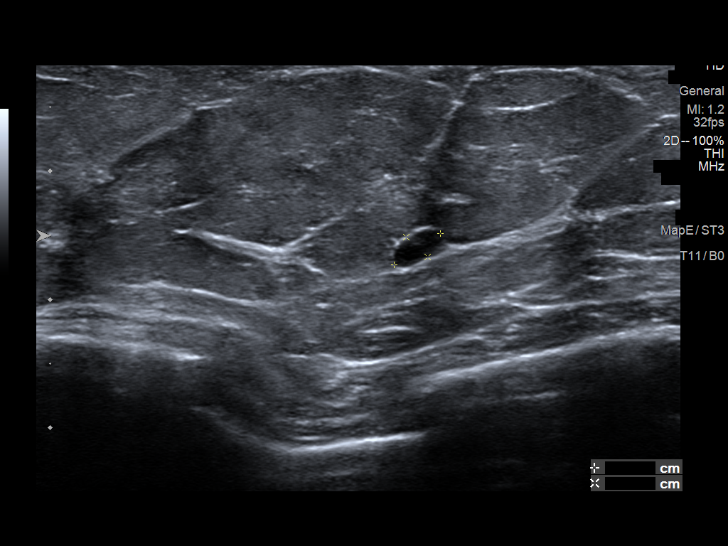
[im 10/10]
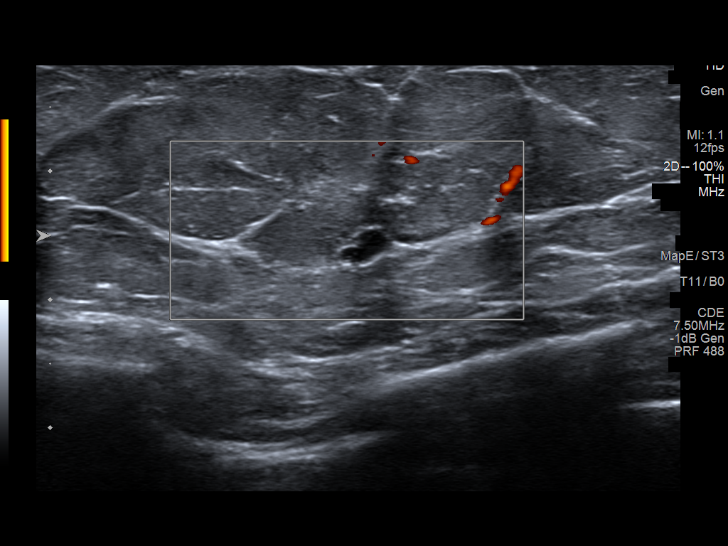

[10 of 10 positions shown; findings below may reference images not displayed]

ACR Breast Density Category b: There are scattered areas of
fibroglandular density.
FINDINGS: 3D tomographic and 2D generated mammographic views of the right
breast confirm a small, oval, circumscribed mass in the posterior,
inferior aspect of the breast.

Mammographic images were processed with CAD.

Targeted ultrasound is performed, showing a 6 mm cyst containing
some low-level internal echoes in the 4 o'clock position of the
right breast, 8 cm from the nipple. No internal blood flow was seen
with power Doppler. This corresponds to the mass seen
mammographically.

Also demonstrated is a 6 mm elongated, simple cyst in the 5 o'clock
position of the right breast, 5 cm from the nipple. This corresponds
to a mammographically stable elongated, oval, circumscribed mass in
the medial aspect of the breast on the mammogram images.
IMPRESSION: Benign right breast cysts.  No evidence of malignancy.

RECOMMENDATION:
Bilateral screening mammogram in 11 months when due.

I have discussed the findings and recommendations with the patient.
If applicable, a reminder letter will be sent to the patient
regarding the next appointment.

BI-RADS CATEGORY  2: Benign.

## 2021-04-09 IMAGING — MG MM DIGITAL DIAGNOSTIC UNILAT*R* W/ TOMO W/ CAD
4 series · 4 of 12 positions shown · non-contrast
Comparison: Previous exam(s).

CLINICAL DATA: Possible asymmetry in the posterior, inferior right
breast in the oblique projection of a recent 2D screening mammogram.

EXAM:
DIGITAL DIAGNOSTIC RIGHT MAMMOGRAM WITH CAD AND TOMO
ULTRASOUND RIGHT BREAST

[R MLO synth-2D]
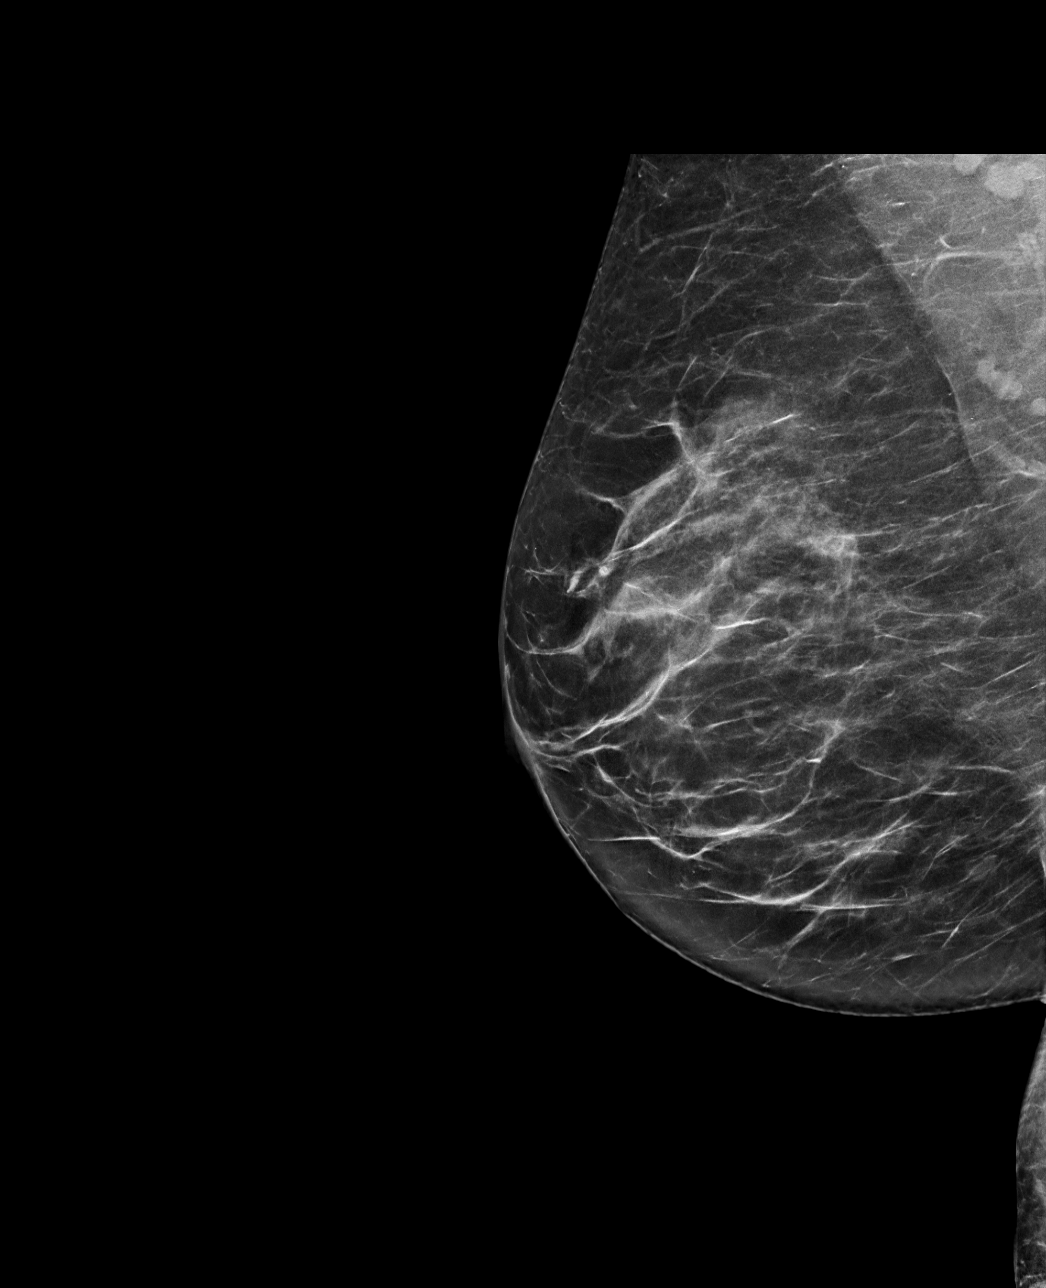

[R CC synth-2D]
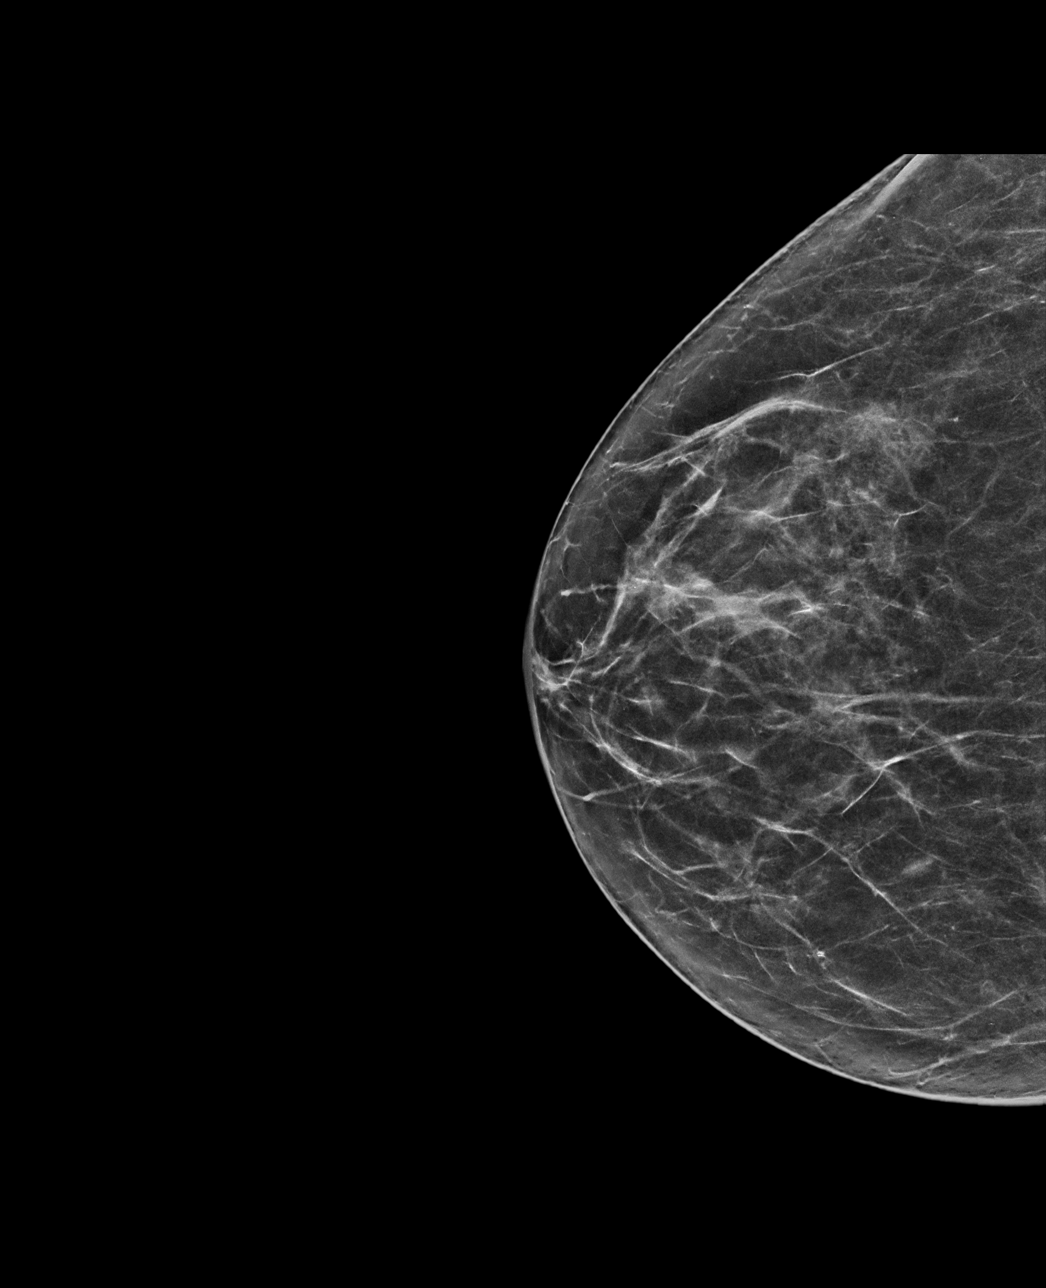

[R MLO tomo · tomo slice 37/74.0]
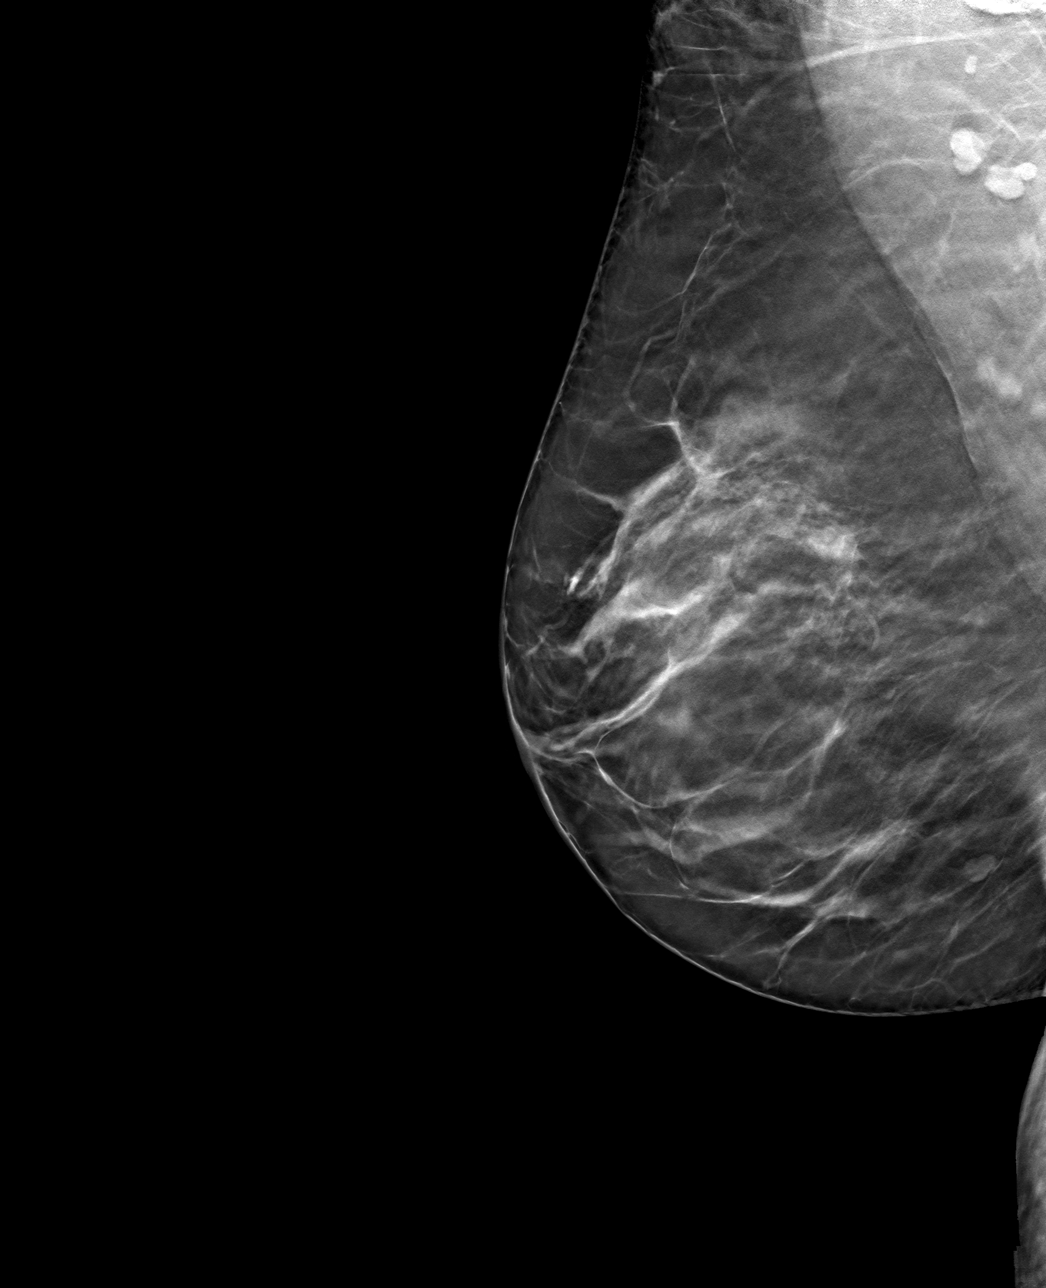

[R CC tomo · tomo slice 35/69.0]
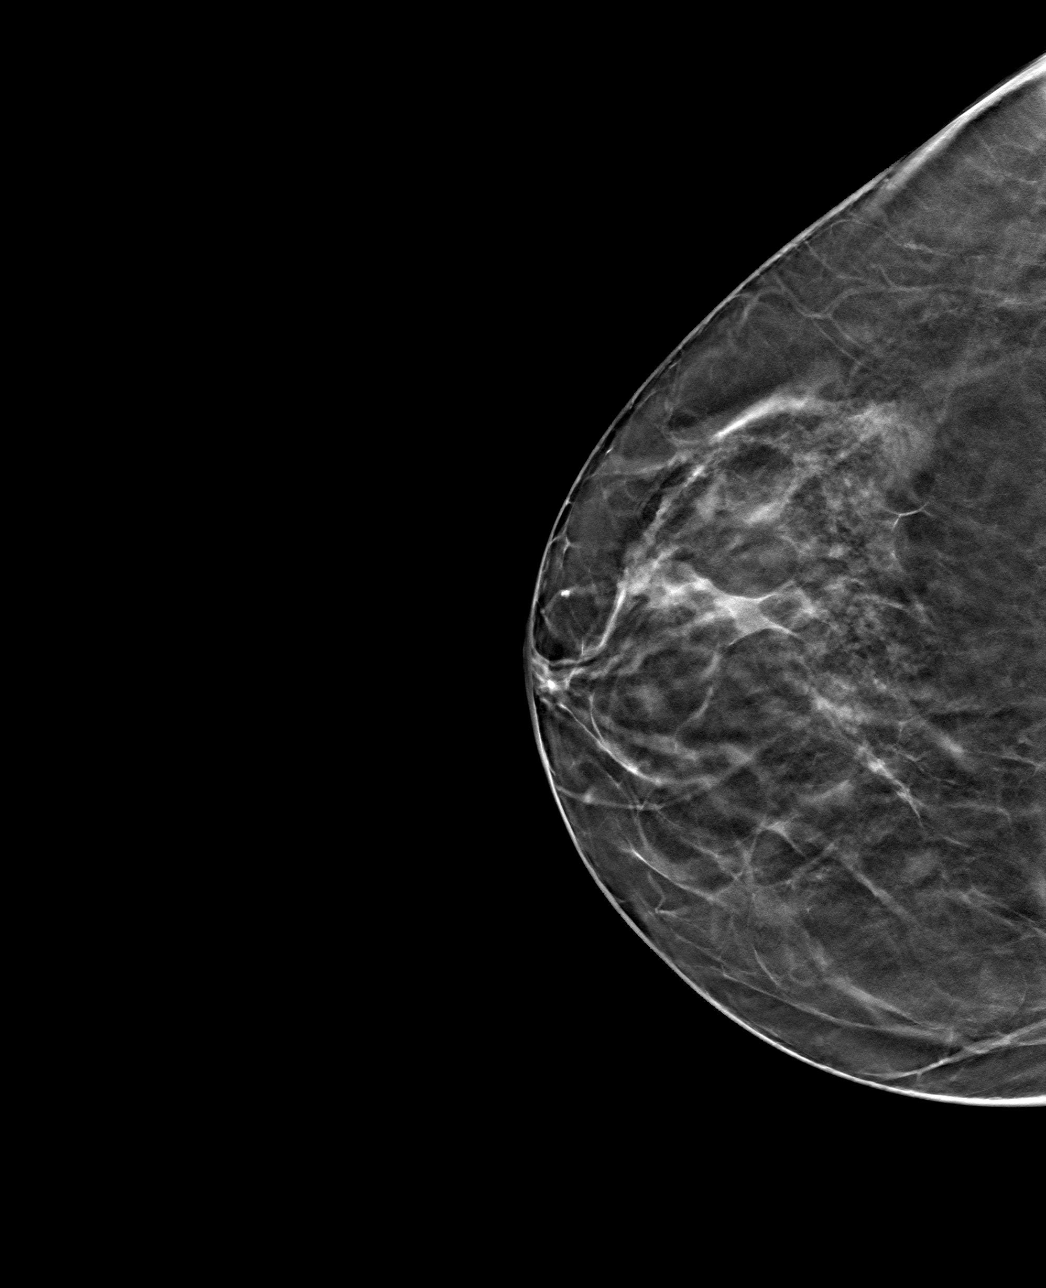

[4 of 12 positions shown; findings below may reference images not displayed]

ACR Breast Density Category b: There are scattered areas of
fibroglandular density.
FINDINGS: 3D tomographic and 2D generated mammographic views of the right
breast confirm a small, oval, circumscribed mass in the posterior,
inferior aspect of the breast.

Mammographic images were processed with CAD.

Targeted ultrasound is performed, showing a 6 mm cyst containing
some low-level internal echoes in the 4 o'clock position of the
right breast, 8 cm from the nipple. No internal blood flow was seen
with power Doppler. This corresponds to the mass seen
mammographically.

Also demonstrated is a 6 mm elongated, simple cyst in the 5 o'clock
position of the right breast, 5 cm from the nipple. This corresponds
to a mammographically stable elongated, oval, circumscribed mass in
the medial aspect of the breast on the mammogram images.
IMPRESSION: Benign right breast cysts.  No evidence of malignancy.

RECOMMENDATION:
Bilateral screening mammogram in 11 months when due.

I have discussed the findings and recommendations with the patient.
If applicable, a reminder letter will be sent to the patient
regarding the next appointment.

BI-RADS CATEGORY  2: Benign.

## 2021-04-29 ENCOUNTER — Ambulatory Visit (INDEPENDENT_AMBULATORY_CARE_PROVIDER_SITE_OTHER): Payer: BC Managed Care – PPO | Admitting: Adult Health

## 2021-05-27 ENCOUNTER — Ambulatory Visit (INDEPENDENT_AMBULATORY_CARE_PROVIDER_SITE_OTHER): Payer: BC Managed Care – PPO | Admitting: Adult Health

## 2021-06-10 ENCOUNTER — Ambulatory Visit (INDEPENDENT_AMBULATORY_CARE_PROVIDER_SITE_OTHER): Payer: BC Managed Care – PPO | Admitting: Family Medicine

## 2021-06-10 ENCOUNTER — Other Ambulatory Visit: Payer: Self-pay

## 2021-06-10 ENCOUNTER — Encounter (INDEPENDENT_AMBULATORY_CARE_PROVIDER_SITE_OTHER): Payer: Self-pay | Admitting: Family Medicine

## 2021-06-10 VITALS — BP 121/74 | HR 68 | Temp 98.0°F | Ht 65.0 in | Wt 181.0 lb

## 2021-06-10 DIAGNOSIS — E559 Vitamin D deficiency, unspecified: Secondary | ICD-10-CM

## 2021-06-10 DIAGNOSIS — R7303 Prediabetes: Secondary | ICD-10-CM | POA: Diagnosis not present

## 2021-06-10 DIAGNOSIS — E669 Obesity, unspecified: Secondary | ICD-10-CM

## 2021-06-10 DIAGNOSIS — E663 Overweight: Secondary | ICD-10-CM | POA: Diagnosis not present

## 2021-06-10 DIAGNOSIS — E7849 Other hyperlipidemia: Secondary | ICD-10-CM | POA: Diagnosis not present

## 2021-06-10 DIAGNOSIS — Z6830 Body mass index (BMI) 30.0-30.9, adult: Secondary | ICD-10-CM

## 2021-06-10 DIAGNOSIS — Z9189 Other specified personal risk factors, not elsewhere classified: Secondary | ICD-10-CM

## 2021-06-10 MED ORDER — METFORMIN HCL 500 MG PO TABS: 500.0000 mg | ORAL_TABLET | Freq: Every day | ORAL | 0 refills | Status: DC

## 2021-06-11 NOTE — Progress Notes (Signed)
? ? ? ?Chief Complaint:  ? ?OBESITY ?Ariel Ellis is here to discuss her progress with her obesity treatment plan along with follow-up of her obesity related diagnoses. Ariel Ellis is on following a lower carbohydrate, vegetable and lean protein rich diet plan and states she is following her eating plan approximately 75% of the time. Ariel Ellis states she is doing 0 minutes 0 times per week. ? ?Today's visit was #: 50 ?Starting weight: 217 lbs ?Starting date: 02/22/2018 ?Today's weight: 181 lbs ?Today's date: 06/10/2021 ?Total lbs lost to date: 72 ?Total lbs lost since last in-office visit: 0 ? ?Interim History: Ariel Ellis's father passed away in 05-01-22. She has been living with her mother in [place]. She hasn't been able to walk on the treadmill since she doesn't have a treadmill at her mother's house. It has been hard to follow her plan since living with her mother. She will be staying with her mother until affairs are taken care of her mother. She denies hunger but reports cravings especially in the evenings.  ? ?Subjective:  ? ?1. Pre-diabetes ?Ariel Ellis's last A1c was 5.7 on 11/25/2020. She reports hunger in the evenings. ? ?2. Vitamin D deficiency ?Ariel Ellis's not currently on Vit D, but she took in the past.  ? ?3. Other hyperlipidemia ?Ariel Ellis has never been on a statin, and her LDL's have been increasing. ? ?4. At risk for heart disease ?Ariel Ellis is at higher than average risk for cardiovascular disease due to obesity. ? ?Assessment/Plan:  ? ?1. Pre-diabetes ?Ariel Ellis agreed to start metformin 500 mg  PO daily with no refills. Side effects were discussed. Handout was given on metformin and pre-diabetes. We will recheck fasting labs at her next visit. She will continue working on dietary changes and exercise to help decrease the risk of diabetes.  ? ?- metFORMIN (GLUCOPHAGE) 500 MG tablet; Take 1 tablet (500 mg total) by mouth daily with breakfast.  Dispense: 30 tablet; Refill: 0 ? ?2. Vitamin D deficiency ?Low Vitamin D level contributes to fatigue and are  associated with obesity, breast, and colon cancer. We will recheck labs at Ariel Ellis's next visit, and she will follow-up for routine testing of Vitamin D, at least 2-3 times per year to avoid over-replacement. ? ?3. Other hyperlipidemia ?Cardiovascular risk and specific lipid/LDL goals reviewed. We discussed several lifestyle modifications today. We will recheck fasting labs at her next visit. Ariel Ellis will continue to work on dietary changes and exercise. Orders and follow up as documented in patient record.  ? ?4. At risk for heart disease ?Ariel Ellis was given approximately 15 minutes of coronary artery disease prevention counseling today. She is 56 y.o. female and has risk factors for heart disease including obesity. We discussed intensive lifestyle modifications today with an emphasis on specific weight loss instructions and strategies. ? ?Repetitive spaced learning was employed today to elicit superior memory formation and behavioral change.  ? ?5. Overweight: Current BMI 30.2 ?Ariel Ellis is currently in the action stage of change. As such, her goal is to continue with weight loss efforts. She has agreed to following a lower carbohydrate, vegetable and lean protein rich diet plan.  ? ?Exercise goals: Plans to start walking again.  ? ?Behavioral modification strategies: increasing lean protein intake, increasing water intake, no skipping meals, meal planning and cooking strategies, and planning for success. ? ?Ariel Ellis has agreed to follow-up with our clinic in 3 weeks. She was informed of the importance of frequent follow-up visits to maximize her success with intensive lifestyle modifications for her multiple health conditions.  ? ?  Objective:  ? ?Blood pressure 121/74, pulse 68, temperature 98 ?F (36.7 ?C), height 5\' 5"  (1.651 m), weight 181 lb (82.1 kg), last menstrual period 04/05/2013, SpO2 98 %. ?Body mass index is 30.12 kg/m?. ? ?General: Cooperative, alert, well developed, in no acute distress. ?HEENT: Conjunctivae and lids  unremarkable. ?Cardiovascular: Regular rhythm.  ?Lungs: Normal work of breathing. ?Neurologic: No focal deficits.  ? ?Lab Results  ?Component Value Date  ? CREATININE 0.83 11/25/2020  ? BUN 17 11/25/2020  ? NA 142 11/25/2020  ? K 4.3 11/25/2020  ? CL 104 11/25/2020  ? CO2 25 11/25/2020  ? ?Lab Results  ?Component Value Date  ? ALT 16 11/25/2020  ? AST 17 11/25/2020  ? ALKPHOS 85 11/25/2020  ? BILITOT 0.5 11/25/2020  ? ?Lab Results  ?Component Value Date  ? HGBA1C 5.7 (H) 11/25/2020  ? HGBA1C 5.6 05/19/2020  ? HGBA1C 5.9 (H) 12/31/2019  ? HGBA1C 5.6 07/16/2019  ? HGBA1C 5.6 03/07/2019  ? ?Lab Results  ?Component Value Date  ? INSULIN 7.1 11/25/2020  ? INSULIN 4.4 05/19/2020  ? INSULIN 5.9 12/31/2019  ? INSULIN 4.3 07/16/2019  ? INSULIN 26.3 (H) 10/16/2018  ? ?Lab Results  ?Component Value Date  ? TSH 1.340 03/07/2019  ? ?Lab Results  ?Component Value Date  ? CHOL 236 (H) 11/25/2020  ? HDL 82 11/25/2020  ? LDLCALC 143 (H) 11/25/2020  ? TRIG 63 11/25/2020  ? CHOLHDL 3.5 01/06/2018  ? ?Lab Results  ?Component Value Date  ? VD25OH 57.8 11/25/2020  ? VD25OH 59.4 05/19/2020  ? VD25OH 56.3 12/31/2019  ? ?Lab Results  ?Component Value Date  ? WBC 5.8 01/06/2018  ? HGB 13.5 01/06/2018  ? HCT 41.2 01/06/2018  ? MCV 86 01/06/2018  ? PLT 272 01/06/2018  ? ?No results found for: IRON, TIBC, FERRITIN ? ?Attestation Statements:  ? ?Reviewed by clinician on day of visit: allergies, medications, problem list, medical history, surgical history, family history, social history, and previous encounter notes. ? ? ?I, Ariel Ellis, am acting as transcriptionist for Burt Knack, MD. ? ?I have reviewed the above documentation for accuracy and completeness, and I agree with the above. -  Ariel Quince, MD ? ? ?

## 2021-06-30 ENCOUNTER — Encounter (INDEPENDENT_AMBULATORY_CARE_PROVIDER_SITE_OTHER): Payer: Self-pay | Admitting: Nurse Practitioner

## 2021-06-30 ENCOUNTER — Other Ambulatory Visit: Payer: Self-pay

## 2021-06-30 ENCOUNTER — Ambulatory Visit (INDEPENDENT_AMBULATORY_CARE_PROVIDER_SITE_OTHER): Payer: BC Managed Care – PPO | Admitting: Nurse Practitioner

## 2021-06-30 VITALS — BP 97/63 | HR 74 | Temp 97.9°F | Ht 65.0 in | Wt 180.0 lb

## 2021-06-30 DIAGNOSIS — R7303 Prediabetes: Secondary | ICD-10-CM

## 2021-06-30 DIAGNOSIS — E559 Vitamin D deficiency, unspecified: Secondary | ICD-10-CM | POA: Diagnosis not present

## 2021-06-30 DIAGNOSIS — R5383 Other fatigue: Secondary | ICD-10-CM

## 2021-06-30 DIAGNOSIS — Z6830 Body mass index (BMI) 30.0-30.9, adult: Secondary | ICD-10-CM

## 2021-06-30 DIAGNOSIS — E663 Overweight: Secondary | ICD-10-CM

## 2021-06-30 DIAGNOSIS — Z9189 Other specified personal risk factors, not elsewhere classified: Secondary | ICD-10-CM | POA: Diagnosis not present

## 2021-06-30 DIAGNOSIS — E7849 Other hyperlipidemia: Secondary | ICD-10-CM | POA: Diagnosis not present

## 2021-06-30 DIAGNOSIS — E669 Obesity, unspecified: Secondary | ICD-10-CM

## 2021-06-30 NOTE — Progress Notes (Signed)
? ? ? ?Chief Complaint:  ? ?OBESITY ?Ariel Ellis is here to discuss her progress with her obesity treatment plan along with follow-up of her obesity related diagnoses. Ariel Ellis is on following a lower carbohydrate, vegetable and lean protein rich diet plan and states she is following her eating plan approximately 100% of the time. [name] states she is walking for 60 minutes 4-5 times per week. ? ?Today's visit was #: 51 ?Starting weight: 217 lbs ?Starting date: 02/22/2018 ?Today's weight: 180 lbs ?Today's date: 06/30/2021 ?Total lbs lost to date: 37 lbs ?Total lbs lost since last in-office visit: 1 lb ? ?Interim History: Ariel Ellis has done well overall with weight loss. She is meeting calories and protein goals. She denies hunger. She notes cravings in the evenings. She is drinking water and 1 hot tea daily and 1 diet soda daily. She is now staying with her mother 2 days per week in [place] so she is able to walking on her treadmill since being at home more.  ? ?Subjective:  ? ?1. Pre-diabetes ?[name] is taking Metformin 500 mg. She denies side effects. Her last A1C was 5.7. She is not sure if helping with appetite or cravings.  ? ?2. Vitamin D deficiency ?Ariel Ellis is currently taking prescription vitamin D 50,000 IU each week. She denies side effects nausea, vomiting or muscle weakness. ? ?3. Other hyperlipidemia ?Ariel Ellis is not on a statin.  ? ?4. Other fatigue ?Ariel Ellis is traveling to mother's house 2 times per week in [place]. She is getting ready for furniture market.  ? ?5. At risk for osteoporosis ?Ariel Ellis is at risk for osteoporosis start of Vitamin D deficiency and to start resistance training.  ? ?Assessment/Plan:  ? ?1. Pre-diabetes ?[name] will continue Metformin 500 mg. She plans to take her lunch. We discussed side effects. Labs was obtained today. We will discuss at next office visit. She will continue to work on weight loss, exercise, and decreasing simple carbohydrates to help decrease the risk of diabetes.  ? ?- Comprehensive metabolic  panel ?- Hemoglobin K4Q ?- Insulin, random ? ?2. Vitamin D deficiency ?Low Vitamin D level contributes to fatigue and are associated with obesity, breast, and colon cancer. We will obtain labs today. Ariel Ellis agrees to continue to take prescription Vitamin D as directed and she will follow-up for routine testing of Vitamin D, at least 2-3 times per year to avoid over-replacement. ? ?- Comprehensive metabolic panel ?- VITAMIN D 25 Hydroxy (Vit-D Deficiency, Fractures) ? ?3. Other hyperlipidemia ?Cardiovascular risk and specific lipid/LDL goals reviewed.  Labs were obtained today. We discussed several lifestyle modifications today and Ariel Ellis will continue to work on diet, exercise and weight loss efforts. Orders and follow up as documented in patient record.  ? ?Counseling ?Intensive lifestyle modifications are the first line treatment for this issue. ?Dietary changes: Increase soluble fiber. Decrease simple carbohydrates. ?Exercise changes: Moderate to vigorous-intensity aerobic activity 150 minutes per week if tolerated. ?Lipid-lowering medications: see documented in medical record. ? ?- Comprehensive metabolic panel ?- Lipid Panel With LDL/HDL Ratio ? ?4. Other fatigue ?Labs obtained today. Ariel Ellis does feel that her weight is causing her energy to be lower than it should be. Fatigue may be related to obesity, depression or many other causes. Labs will be ordered, and in the meanwhile, Ariel Ellis will focus on self care including making healthy food choices, increasing physical activity and focusing on stress reduction.  ? ?- TSH ? ?5. At risk for osteoporosis ?Ariel Ellis was given approximately 15 minutes of osteoporosis prevention  counseling today. Ariel Ellis is at risk for osteopenia and osteoporosis due to her Vitamin D deficiency. She was encouraged to take her Vit D and follow her higher calcium diet and increase strengthening exercise to help strengthen her bones and decrease her risk of osteopenia and osteoporosis.  ? ?6. Overweight:  Current BMI 30.1 ?Ariel Ellis is currently in the action stage of change. As such, her goal is to continue with weight loss efforts. She has agreed to following a lower carbohydrate, vegetable and lean protein rich diet plan.  ? ?Exercise goals:  As is. ? ?Behavioral modification strategies: increasing lean protein intake, increasing water intake, no skipping meals, and meal planning and cooking strategies. ? ?Ariel Ellis has agreed to follow-up with our clinic in 3 weeks. She was informed of the importance of frequent follow-up visits to maximize her success with intensive lifestyle modifications for her multiple health conditions.  ? ?Ariel Ellis was informed we would discuss her lab results at her next visit unless there is a critical issue that needs to be addressed sooner. Ariel Ellis agreed to keep her next visit at the agreed upon time to discuss these results. ? ?Objective:  ? ?Blood pressure 97/63, pulse 74, temperature 97.9 ?F (36.6 ?C), height 5\' 5"  (1.651 m), weight 180 lb (81.6 kg), last menstrual period 04/05/2013, SpO2 98 %. ?Body mass index is 29.95 kg/m?. ? ?General: Cooperative, alert, well developed, in no acute distress. ?HEENT: Conjunctivae and lids unremarkable. ?Cardiovascular: Regular rhythm.  ?Lungs: Normal work of breathing. ?Neurologic: No focal deficits.  ? ?Lab Results  ?Component Value Date  ? CREATININE 0.83 11/25/2020  ? BUN 17 11/25/2020  ? NA 142 11/25/2020  ? K 4.3 11/25/2020  ? CL 104 11/25/2020  ? CO2 25 11/25/2020  ? ?Lab Results  ?Component Value Date  ? ALT 16 11/25/2020  ? AST 17 11/25/2020  ? ALKPHOS 85 11/25/2020  ? BILITOT 0.5 11/25/2020  ? ?Lab Results  ?Component Value Date  ? HGBA1C 5.7 (H) 11/25/2020  ? HGBA1C 5.6 05/19/2020  ? HGBA1C 5.9 (H) 12/31/2019  ? HGBA1C 5.6 07/16/2019  ? HGBA1C 5.6 03/07/2019  ? ?Lab Results  ?Component Value Date  ? INSULIN 7.1 11/25/2020  ? INSULIN 4.4 05/19/2020  ? INSULIN 5.9 12/31/2019  ? INSULIN 4.3 07/16/2019  ? INSULIN 26.3 (H) 10/16/2018  ? ?Lab Results  ?Component  Value Date  ? TSH 1.340 03/07/2019  ? ?Lab Results  ?Component Value Date  ? CHOL 236 (H) 11/25/2020  ? HDL 82 11/25/2020  ? LDLCALC 143 (H) 11/25/2020  ? TRIG 63 11/25/2020  ? CHOLHDL 3.5 01/06/2018  ? ?Lab Results  ?Component Value Date  ? VD25OH 57.8 11/25/2020  ? VD25OH 59.4 05/19/2020  ? VD25OH 56.3 12/31/2019  ? ?Lab Results  ?Component Value Date  ? WBC 5.8 01/06/2018  ? HGB 13.5 01/06/2018  ? HCT 41.2 01/06/2018  ? MCV 86 01/06/2018  ? PLT 272 01/06/2018  ? ?No results found for: IRON, TIBC, FERRITIN ? ?Attestation Statements:  ? ?Reviewed by clinician on day of visit: allergies, medications, problem list, medical history, surgical history, family history, social history, and previous encounter notes. ? ?I, 03/08/2018, RMA, am acting as [employer] for Energy manager, FNP. ? ?I have reviewed the above documentation for accuracy and completeness, and I agree with the above. Irene Limbo, FNP ? ?

## 2021-07-01 LAB — COMPREHENSIVE METABOLIC PANEL
ALT: 20 IU/L (ref 0–32)
AST: 19 IU/L (ref 0–40)
Albumin/Globulin Ratio: 2.3 — ABNORMAL HIGH (ref 1.2–2.2)
Albumin: 4.3 g/dL (ref 3.8–4.9)
Alkaline Phosphatase: 82 IU/L (ref 44–121)
BUN/Creatinine Ratio: 21 (ref 9–23)
BUN: 17 mg/dL (ref 6–24)
Bilirubin Total: 0.3 mg/dL (ref 0.0–1.2)
CO2: 22 mmol/L (ref 20–29)
Calcium: 9 mg/dL (ref 8.7–10.2)
Chloride: 105 mmol/L (ref 96–106)
Creatinine, Ser: 0.82 mg/dL (ref 0.57–1.00)
Globulin, Total: 1.9 g/dL (ref 1.5–4.5)
Glucose: 87 mg/dL (ref 70–99)
Potassium: 4.9 mmol/L (ref 3.5–5.2)
Sodium: 142 mmol/L (ref 134–144)
Total Protein: 6.2 g/dL (ref 6.0–8.5)
eGFR: 84 mL/min/{1.73_m2} (ref >59)

## 2021-07-01 LAB — HEMOGLOBIN A1C
Est. average glucose Bld gHb Est-mCnc: 120 mg/dL
Hgb A1c MFr Bld: 5.8 % — ABNORMAL HIGH (ref 4.8–5.6)

## 2021-07-01 LAB — TSH: TSH: 1.9 u[IU]/mL (ref 0.450–4.500)

## 2021-07-01 LAB — INSULIN, RANDOM: INSULIN: 4.9 u[IU]/mL (ref 2.6–24.9)

## 2021-07-01 LAB — LIPID PANEL WITH LDL/HDL RATIO
Cholesterol, Total: 203 mg/dL — ABNORMAL HIGH (ref 100–199)
HDL: 77 mg/dL (ref >39)
LDL Chol Calc (NIH): 110 mg/dL — ABNORMAL HIGH (ref 0–99)
LDL/HDL Ratio: 1.4 ratio (ref 0.0–3.2)
Triglycerides: 91 mg/dL (ref 0–149)
VLDL Cholesterol Cal: 16 mg/dL (ref 5–40)

## 2021-07-01 LAB — VITAMIN D 25 HYDROXY (VIT D DEFICIENCY, FRACTURES): Vit D, 25-Hydroxy: 72.1 ng/mL (ref 30.0–100.0)

## 2021-07-05 DIAGNOSIS — L03113 Cellulitis of right upper limb: Secondary | ICD-10-CM | POA: Diagnosis not present

## 2021-07-21 ENCOUNTER — Encounter (INDEPENDENT_AMBULATORY_CARE_PROVIDER_SITE_OTHER): Payer: Self-pay | Admitting: Family Medicine

## 2021-07-21 ENCOUNTER — Ambulatory Visit (INDEPENDENT_AMBULATORY_CARE_PROVIDER_SITE_OTHER): Payer: BC Managed Care – PPO | Admitting: Family Medicine

## 2021-07-21 VITALS — BP 116/75 | Ht 65.0 in | Wt 181.0 lb

## 2021-07-21 DIAGNOSIS — G4700 Insomnia, unspecified: Secondary | ICD-10-CM | POA: Diagnosis not present

## 2021-07-21 DIAGNOSIS — R7303 Prediabetes: Secondary | ICD-10-CM | POA: Diagnosis not present

## 2021-07-21 DIAGNOSIS — E669 Obesity, unspecified: Secondary | ICD-10-CM | POA: Diagnosis not present

## 2021-07-21 DIAGNOSIS — Z9189 Other specified personal risk factors, not elsewhere classified: Secondary | ICD-10-CM

## 2021-07-21 DIAGNOSIS — E559 Vitamin D deficiency, unspecified: Secondary | ICD-10-CM | POA: Diagnosis not present

## 2021-07-21 DIAGNOSIS — Z6830 Body mass index (BMI) 30.0-30.9, adult: Secondary | ICD-10-CM

## 2021-08-03 NOTE — Progress Notes (Signed)
? ? ? ?Chief Complaint:  ? ?OBESITY ?Ariel Ellis is here to discuss her progress with her obesity treatment plan along with follow-up of her obesity related diagnoses. Ariel Ellis is on following a lower carbohydrate, vegetable and lean protein rich diet plan and states she is following her eating plan approximately 95% of the time. Ariel Ellis states she is walking for 1 hour 5 times per week.   ? ?Today's visit was #: 52 ?Starting weight: 217 lbs ?Starting date: 02/22/2018 ?Today's weight: 181 lbs ?Today's date: 07/21/2021 ?Total lbs lost to date: 38 ?Total lbs lost since last in-office visit: 0 ? ?Interim History: Ariel Ellis has had a lot of stress at her job recently. She hasn't  been able to concentrate on weight loss as much and she is at risk of decreased RMR due to this.  ? ?Subjective:  ? ?1. Vitamin D deficiency ?Ariel Ellis's Vitamin D level is at goal. She stopped her Vitamin D at her last visit. I discussed labs with the patient today. ? ?2. Pre-diabetes ?Ariel Ellis is working on diet and exercise, but her A1c is creeping up despite the good food choices she is making. Her stress level is elevated and her sleep hasn't been good recently. I discussed labs with the patient today. ? ?3. Insomnia, unspecified type ?Ariel Ellis is taking melatonin OTC as needed, and usually after not being able to fall asleep for a few hours.  ? ?4. At risk for impaired metabolic function ?Ariel Ellis is at increased risk for impaired metabolic function if calories or protein drops too low. ? ?Assessment/Plan:  ? ?1. Vitamin D deficiency ?Hero agreed to hold Vitamin D for now, and we will recheck labs in 3 month.  ? ?2. Pre-diabetes ?We will refill metformin 500 mg daily #30 for 1 month. Elliot will continue to work on weight loss, exercise, and decreasing simple carbohydrates to help decrease the risk of diabetes.  ? ?3. Insomnia, unspecified type ?Mileah agreed to change her melatonin 1-2 hours before bed and we will follow up in 1 month. ? ?4. At risk for impaired metabolic function ?Ariel Ellis  was given approximately 15 minutes of impaired  metabolic function prevention counseling today. We discussed intensive lifestyle modifications today with an emphasis on specific nutrition and exercise instructions and strategies.  ? ?Repetitive spaced learning was employed today to elicit superior memory formation and behavioral change. ? ?5. Obesity, with current BMI of 30.1 ?Ariel Ellis is currently in the action stage of change. As such, her goal is to continue with weight loss efforts. She has agreed to following a lower carbohydrate, vegetable and lean protein rich diet plan.  ? ?Exercise goals: As is.  ? ?Behavioral modification strategies: increasing lean protein intake and emotional eating strategies. ? ?Ariel Ellis has agreed to follow-up with our clinic in 3 to 4 weeks. She was informed of the importance of frequent follow-up visits to maximize her success with intensive lifestyle modifications for her multiple health conditions.  ? ?Objective:  ? ?Blood pressure 116/75, height 5\' 5"  (1.651 m), weight 181 lb (82.1 kg), last menstrual period 04/05/2013. ?Body mass index is 30.12 kg/m?. ? ?General: Cooperative, alert, well developed, in no acute distress. ?HEENT: Conjunctivae and lids unremarkable. ?Cardiovascular: Regular rhythm.  ?Lungs: Normal work of breathing. ?Neurologic: No focal deficits.  ? ?Lab Results  ?Component Value Date  ? CREATININE 0.82 06/30/2021  ? BUN 17 06/30/2021  ? NA 142 06/30/2021  ? K 4.9 06/30/2021  ? CL 105 06/30/2021  ? CO2 22 06/30/2021  ? ?Lab  Results  ?Component Value Date  ? ALT 20 06/30/2021  ? AST 19 06/30/2021  ? ALKPHOS 82 06/30/2021  ? BILITOT 0.3 06/30/2021  ? ?Lab Results  ?Component Value Date  ? HGBA1C 5.8 (H) 06/30/2021  ? HGBA1C 5.7 (H) 11/25/2020  ? HGBA1C 5.6 05/19/2020  ? HGBA1C 5.9 (H) 12/31/2019  ? HGBA1C 5.6 07/16/2019  ? ?Lab Results  ?Component Value Date  ? INSULIN 4.9 06/30/2021  ? INSULIN 7.1 11/25/2020  ? INSULIN 4.4 05/19/2020  ? INSULIN 5.9 12/31/2019  ? INSULIN 4.3  07/16/2019  ? ?Lab Results  ?Component Value Date  ? TSH 1.900 06/30/2021  ? ?Lab Results  ?Component Value Date  ? CHOL 203 (H) 06/30/2021  ? HDL 77 06/30/2021  ? LDLCALC 110 (H) 06/30/2021  ? TRIG 91 06/30/2021  ? CHOLHDL 3.5 01/06/2018  ? ?Lab Results  ?Component Value Date  ? VD25OH 72.1 06/30/2021  ? VD25OH 57.8 11/25/2020  ? VD25OH 59.4 05/19/2020  ? ?Lab Results  ?Component Value Date  ? WBC 5.8 01/06/2018  ? HGB 13.5 01/06/2018  ? HCT 41.2 01/06/2018  ? MCV 86 01/06/2018  ? PLT 272 01/06/2018  ? ?No results found for: IRON, TIBC, FERRITIN ? ?Attestation Statements:  ? ?Reviewed by clinician on day of visit: allergies, medications, problem list, medical history, surgical history, family history, social history, and previous encounter notes. ? ? ?I, Burt Knack, am acting as transcriptionist for Quillian Quince, MD. ? ?I have reviewed the above documentation for accuracy and completeness, and I agree with the above. -  Quillian Quince, MD ? ? ?

## 2021-08-04 MED ORDER — METFORMIN HCL 500 MG PO TABS: 500.0000 mg | ORAL_TABLET | Freq: Every day | ORAL | 0 refills | Status: DC

## 2021-08-18 ENCOUNTER — Encounter (INDEPENDENT_AMBULATORY_CARE_PROVIDER_SITE_OTHER): Payer: Self-pay | Admitting: Family Medicine

## 2021-08-18 ENCOUNTER — Ambulatory Visit (INDEPENDENT_AMBULATORY_CARE_PROVIDER_SITE_OTHER): Payer: BC Managed Care – PPO | Admitting: Family Medicine

## 2021-08-18 VITALS — BP 113/73 | HR 71 | Temp 98.0°F | Ht 65.0 in | Wt 184.0 lb

## 2021-08-18 DIAGNOSIS — E669 Obesity, unspecified: Secondary | ICD-10-CM | POA: Diagnosis not present

## 2021-08-18 DIAGNOSIS — Z6830 Body mass index (BMI) 30.0-30.9, adult: Secondary | ICD-10-CM

## 2021-08-18 DIAGNOSIS — Z7984 Long term (current) use of oral hypoglycemic drugs: Secondary | ICD-10-CM | POA: Diagnosis not present

## 2021-08-18 DIAGNOSIS — R7303 Prediabetes: Secondary | ICD-10-CM

## 2021-09-01 NOTE — Progress Notes (Signed)
Chief Complaint:   OBESITY Ariel Ellis is here to discuss her progress with her obesity treatment plan along with follow-up of her obesity related diagnoses. Ariel Ellis is on following a lower carbohydrate, vegetable and lean protein rich diet plan and states she is following her eating plan approximately 50-75% of the time. Ariel Ellis states she is walking for 60 minutes 5 times per week.  Today's visit was #: 53 Starting weight: 217 lbs Starting date: 02/22/2018 Today's weight: 184 lbs Today's date: 08/18/2021 Total lbs lost to date: 33 Total lbs lost since last in-office visit: 0  Interim History: Ariel Ellis has been traveling and doing a lot more walking in the month. She has been eating more carbohydrates. She is struggling to reduce her carbohydrates.   Subjective:   1. Pre-diabetes Ariel Ellis's routine is to work on her diet and weight loss. She is trying to decrease simple carbohydrates. She forgets her metformin sometimes. No side effects were noted.  Assessment/Plan:   1. Pre-diabetes Ariel Ellis will continue metformin as is, and will continue her diet and weight loss.  2. Obesity, Current BMI 30.6 Ariel Ellis is currently in the action stage of change. As such, her goal is to continue with weight loss efforts. She has agreed to following a lower carbohydrate, vegetable and lean protein rich diet plan.   Exercise goals: As is.   Behavioral modification strategies: increasing lean protein intake and increasing water intake.  Ariel Ellis has agreed to follow-up with our clinic in 4 weeks. She was informed of the importance of frequent follow-up visits to maximize her success with intensive lifestyle modifications for her multiple health conditions.   Objective:   Blood pressure 113/73, pulse 71, temperature 98 F (36.7 C), height 5\' 5"  (1.651 m), weight 184 lb (83.5 kg), last menstrual period 04/05/2013, SpO2 97 %. Body mass index is 30.62 kg/m.  General: Cooperative, alert, well developed, in no acute  distress. HEENT: Conjunctivae and lids unremarkable. Cardiovascular: Regular rhythm.  Lungs: Normal work of breathing. Neurologic: No focal deficits.   Lab Results  Component Value Date   CREATININE 0.82 06/30/2021   BUN 17 06/30/2021   NA 142 06/30/2021   K 4.9 06/30/2021   CL 105 06/30/2021   CO2 22 06/30/2021   Lab Results  Component Value Date   ALT 20 06/30/2021   AST 19 06/30/2021   ALKPHOS 82 06/30/2021   BILITOT 0.3 06/30/2021   Lab Results  Component Value Date   HGBA1C 5.8 (H) 06/30/2021   HGBA1C 5.7 (H) 11/25/2020   HGBA1C 5.6 05/19/2020   HGBA1C 5.9 (H) 12/31/2019   HGBA1C 5.6 07/16/2019   Lab Results  Component Value Date   INSULIN 4.9 06/30/2021   INSULIN 7.1 11/25/2020   INSULIN 4.4 05/19/2020   INSULIN 5.9 12/31/2019   INSULIN 4.3 07/16/2019   Lab Results  Component Value Date   TSH 1.900 06/30/2021   Lab Results  Component Value Date   CHOL 203 (H) 06/30/2021   HDL 77 06/30/2021   LDLCALC 110 (H) 06/30/2021   TRIG 91 06/30/2021   CHOLHDL 3.5 01/06/2018   Lab Results  Component Value Date   VD25OH 72.1 06/30/2021   VD25OH 57.8 11/25/2020   VD25OH 59.4 05/19/2020   Lab Results  Component Value Date   WBC 5.8 01/06/2018   HGB 13.5 01/06/2018   HCT 41.2 01/06/2018   MCV 86 01/06/2018   PLT 272 01/06/2018   No results found for: IRON, TIBC, FERRITIN  Attestation Statements:  Reviewed by clinician on day of visit: allergies, medications, problem list, medical history, surgical history, family history, social history, and previous encounter notes.  Time spent on visit including pre-visit chart review and post-visit care and charting was 30 minutes.   I, Trixie Dredge, am acting as transcriptionist for Dennard Nip, MD.  I have reviewed the above documentation for accuracy and completeness, and I agree with the above. -  Dennard Nip, MD

## 2021-09-21 ENCOUNTER — Ambulatory Visit (INDEPENDENT_AMBULATORY_CARE_PROVIDER_SITE_OTHER): Payer: BC Managed Care – PPO | Admitting: Family Medicine

## 2021-09-21 ENCOUNTER — Encounter (INDEPENDENT_AMBULATORY_CARE_PROVIDER_SITE_OTHER): Payer: Self-pay | Admitting: Family Medicine

## 2021-09-21 VITALS — BP 115/73 | HR 76 | Temp 98.3°F | Ht 65.0 in | Wt 185.0 lb

## 2021-09-21 DIAGNOSIS — Z6830 Body mass index (BMI) 30.0-30.9, adult: Secondary | ICD-10-CM

## 2021-09-21 DIAGNOSIS — R7303 Prediabetes: Secondary | ICD-10-CM | POA: Diagnosis not present

## 2021-09-21 DIAGNOSIS — E669 Obesity, unspecified: Secondary | ICD-10-CM | POA: Diagnosis not present

## 2021-09-22 ENCOUNTER — Other Ambulatory Visit: Payer: Self-pay | Admitting: Obstetrics and Gynecology

## 2021-09-22 ENCOUNTER — Ambulatory Visit
Admission: RE | Admit: 2021-09-22 | Discharge: 2021-09-22 | Disposition: A | Discharge status: Home / Self Care | Payer: BC Managed Care – PPO | Source: Ambulatory Visit | Attending: Obstetrics and Gynecology | Admitting: Obstetrics and Gynecology

## 2021-09-22 DIAGNOSIS — Z1231 Encounter for screening mammogram for malignant neoplasm of breast: Secondary | ICD-10-CM

## 2021-09-22 NOTE — Progress Notes (Signed)
Chief Complaint:   OBESITY Ariel Ellis is here to discuss her progress with her obesity treatment plan along with follow-up of her obesity related diagnoses. [name] is on following a lower carbohydrate, vegetable and lean protein rich diet plan and states she is following her eating plan approximately 50% of the time. [name] states she is walking for 60 minutes 3-4 times per week.  Today's visit was #: 54 Starting weight: 217 lbs Starting date: 02/22/2018 Today's weight: 185 lbs Today's date: 09/21/2021 Total lbs lost to date: 32 Total lbs lost since last in-office visit: 0  Interim History: [name] has been exceptionally busy at work and not had much time to focus on her diet or meal plan.  Things have calm down and she is ready to get back on track.  Subjective:   1. Pre-diabetes [name] has not been able to take her metformin regularly.  She is working on getting back on track.  Assessment/Plan:   1. Pre-diabetes [name] will continue her metformin daily, may need to increase to twice daily at her next visit depending on how she is doing.  2. Obesity, Current BMI 30.9 [name] is currently in the action stage of change. As such, her goal is to continue with weight loss efforts. She has agreed to change to the Category 1 Plan with lean meat equivalents.   Exercise goals: As is.  Behavioral modification strategies: increasing lean protein intake.  [name] has agreed to follow-up with our clinic in 4 weeks. She was informed of the importance of frequent follow-up visits to maximize her success with intensive lifestyle modifications for her multiple health conditions.   Objective:   Blood pressure 115/73, pulse 76, temperature 98.3 F (36.8 C), height 5\' 5"  (1.651 m), weight 185 lb (83.9 kg), last menstrual period 04/05/2012, SpO2 97 %. Body mass index is 30.79 kg/m.  General: Cooperative, alert, well developed, in no acute distress. HEENT: Conjunctivae and lids unremarkable. Cardiovascular: Regular  rhythm.  Lungs: Normal work of breathing. Neurologic: No focal deficits.   Lab Results  Component Value Date   CREATININE 0.82 06/30/2021   BUN 17 06/30/2021   NA 142 06/30/2021   K 4.9 06/30/2021   CL 105 06/30/2021   CO2 22 06/30/2021   Lab Results  Component Value Date   ALT 20 06/30/2021   AST 19 06/30/2021   ALKPHOS 82 06/30/2021   BILITOT 0.3 06/30/2021   Lab Results  Component Value Date   HGBA1C 5.8 (H) 06/30/2021   HGBA1C 5.7 (H) 11/25/2020   HGBA1C 5.6 05/19/2020   HGBA1C 5.9 (H) 12/31/2019   HGBA1C 5.6 07/16/2019   Lab Results  Component Value Date   INSULIN 4.9 06/30/2021   INSULIN 7.1 11/25/2020   INSULIN 4.4 05/19/2020   INSULIN 5.9 12/31/2019   INSULIN 4.3 07/16/2019   Lab Results  Component Value Date   TSH 1.900 06/30/2021   Lab Results  Component Value Date   CHOL 203 (H) 06/30/2021   HDL 77 06/30/2021   LDLCALC 110 (H) 06/30/2021   TRIG 91 06/30/2021   CHOLHDL 3.5 01/06/2018   Lab Results  Component Value Date   VD25OH 72.1 06/30/2021   VD25OH 57.8 11/25/2020   VD25OH 59.4 05/19/2020   Lab Results  Component Value Date   WBC 5.8 01/06/2018   HGB 13.5 01/06/2018   HCT 41.2 01/06/2018   MCV 86 01/06/2018   PLT 272 01/06/2018   No results found for: "IRON", "TIBC", "FERRITIN"  Attestation Statements:  Reviewed by clinician on day of visit: allergies, medications, problem list, medical history, surgical history, family history, social history, and previous encounter notes.   I, Trixie Dredge, am acting as transcriptionist for Dennard Nip, MD.  I have reviewed the above documentation for accuracy and completeness, and I agree with the above. -  Dennard Nip, MD

## 2021-09-24 ENCOUNTER — Other Ambulatory Visit: Payer: Self-pay | Admitting: Obstetrics and Gynecology

## 2021-09-24 DIAGNOSIS — R928 Other abnormal and inconclusive findings on diagnostic imaging of breast: Secondary | ICD-10-CM

## 2021-09-29 ENCOUNTER — Ambulatory Visit
Admission: RE | Admit: 2021-09-29 | Discharge: 2021-09-29 | Disposition: A | Discharge status: Home / Self Care | Payer: BC Managed Care – PPO | Source: Ambulatory Visit | Attending: Obstetrics and Gynecology | Admitting: Obstetrics and Gynecology

## 2021-09-29 DIAGNOSIS — R928 Other abnormal and inconclusive findings on diagnostic imaging of breast: Secondary | ICD-10-CM | POA: Diagnosis not present

## 2021-09-29 DIAGNOSIS — N6012 Diffuse cystic mastopathy of left breast: Secondary | ICD-10-CM | POA: Diagnosis not present

## 2021-10-19 ENCOUNTER — Ambulatory Visit (INDEPENDENT_AMBULATORY_CARE_PROVIDER_SITE_OTHER): Payer: BC Managed Care – PPO | Admitting: Family Medicine

## 2021-10-19 ENCOUNTER — Encounter (INDEPENDENT_AMBULATORY_CARE_PROVIDER_SITE_OTHER): Payer: Self-pay | Admitting: Family Medicine

## 2021-10-19 VITALS — BP 117/71 | HR 65 | Temp 98.1°F | Ht 65.0 in | Wt 185.0 lb

## 2021-10-19 DIAGNOSIS — R7303 Prediabetes: Secondary | ICD-10-CM

## 2021-10-19 DIAGNOSIS — E7849 Other hyperlipidemia: Secondary | ICD-10-CM | POA: Diagnosis not present

## 2021-10-19 DIAGNOSIS — Z6830 Body mass index (BMI) 30.0-30.9, adult: Secondary | ICD-10-CM

## 2021-10-19 DIAGNOSIS — Z6836 Body mass index (BMI) 36.0-36.9, adult: Secondary | ICD-10-CM

## 2021-10-19 DIAGNOSIS — E669 Obesity, unspecified: Secondary | ICD-10-CM | POA: Diagnosis not present

## 2021-10-19 MED ORDER — METFORMIN HCL 500 MG PO TABS: 500.0000 mg | ORAL_TABLET | Freq: Every day | ORAL | 0 refills | Status: DC

## 2021-10-20 NOTE — Progress Notes (Signed)
Chief Complaint:   OBESITY Ariel Ellis is here to discuss her progress with her obesity treatment plan along with follow-up of her obesity related diagnoses. Ariel Ellis is on {MWMwtlossportion/plan2:23431} and states she is following her eating plan approximately ***% of the time. Ariel Ellis states she is *** *** minutes *** times per week.  Today's visit was #: *** Starting weight: *** Starting date: *** Today's weight: *** Today's date: 10/19/2021 Total lbs lost to date: *** Total lbs lost since last in-office visit: ***  Interim History: ***  Subjective:   1. Pre-diabetes ***  2. Other hyperlipidemia ***  Assessment/Plan:   1. Pre-diabetes *** - metFORMIN (GLUCOPHAGE) 500 MG tablet; Take 1 tablet (500 mg total) by mouth daily with breakfast.  Dispense: 30 tablet; Refill: 0  2. Other hyperlipidemia ***  3. Obesity, Current BMI 30.8 Ariel Ellis is currently in the action stage of change. As such, her goal is to continue with weight loss efforts. She has agreed to the Category 1 Plan.   Exercise goals: As is.   Behavioral modification strategies: increasing lean protein intake, increasing water intake, and travel eating strategies.  Ariel Ellis has agreed to follow-up with our clinic in 4 weeks. She was informed of the importance of frequent follow-up visits to maximize her success with intensive lifestyle modifications for her multiple health conditions.   Objective:   Blood pressure 117/71, pulse 65, temperature 98.1 F (36.7 C), height 5\' 5"  (1.651 m), weight 185 lb (83.9 kg), last menstrual period 04/05/2012, SpO2 97 %. Body mass index is 30.79 kg/m.  General: Cooperative, alert, well developed, in no acute distress. HEENT: Conjunctivae and lids unremarkable. Cardiovascular: Regular rhythm.  Lungs: Normal work of breathing. Neurologic: No focal deficits.   Lab Results  Component Value Date   CREATININE 0.82 06/30/2021   BUN 17 06/30/2021   NA 142 06/30/2021   K 4.9 06/30/2021   CL  105 06/30/2021   CO2 22 06/30/2021   Lab Results  Component Value Date   ALT 20 06/30/2021   AST 19 06/30/2021   ALKPHOS 82 06/30/2021   BILITOT 0.3 06/30/2021   Lab Results  Component Value Date   HGBA1C 5.8 (H) 06/30/2021   HGBA1C 5.7 (H) 11/25/2020   HGBA1C 5.6 05/19/2020   HGBA1C 5.9 (H) 12/31/2019   HGBA1C 5.6 07/16/2019   Lab Results  Component Value Date   INSULIN 4.9 06/30/2021   INSULIN 7.1 11/25/2020   INSULIN 4.4 05/19/2020   INSULIN 5.9 12/31/2019   INSULIN 4.3 07/16/2019   Lab Results  Component Value Date   TSH 1.900 06/30/2021   Lab Results  Component Value Date   CHOL 203 (H) 06/30/2021   HDL 77 06/30/2021   LDLCALC 110 (H) 06/30/2021   TRIG 91 06/30/2021   CHOLHDL 3.5 01/06/2018   Lab Results  Component Value Date   VD25OH 72.1 06/30/2021   VD25OH 57.8 11/25/2020   VD25OH 59.4 05/19/2020   Lab Results  Component Value Date   WBC 5.8 01/06/2018   HGB 13.5 01/06/2018   HCT 41.2 01/06/2018   MCV 86 01/06/2018   PLT 272 01/06/2018   No results found for: "IRON", "TIBC", "FERRITIN"  Attestation Statements:   Reviewed by clinician on day of visit: allergies, medications, problem list, medical history, surgical history, family history, social history, and previous encounter notes.   I, 03/08/2018, am acting as transcriptionist for Burt Knack, MD.  I have reviewed the above documentation for accuracy and completeness, and I agree  with the above. -  ***

## 2021-11-11 ENCOUNTER — Encounter (INDEPENDENT_AMBULATORY_CARE_PROVIDER_SITE_OTHER): Payer: Self-pay

## 2021-11-17 ENCOUNTER — Encounter (INDEPENDENT_AMBULATORY_CARE_PROVIDER_SITE_OTHER): Payer: Self-pay | Admitting: Family Medicine

## 2021-11-17 ENCOUNTER — Ambulatory Visit (INDEPENDENT_AMBULATORY_CARE_PROVIDER_SITE_OTHER): Payer: BC Managed Care – PPO | Admitting: Family Medicine

## 2021-11-17 VITALS — BP 101/67 | HR 70 | Temp 97.7°F | Ht 65.0 in | Wt 184.0 lb

## 2021-11-17 DIAGNOSIS — E559 Vitamin D deficiency, unspecified: Secondary | ICD-10-CM | POA: Diagnosis not present

## 2021-11-17 DIAGNOSIS — R7303 Prediabetes: Secondary | ICD-10-CM

## 2021-11-17 DIAGNOSIS — Z7984 Long term (current) use of oral hypoglycemic drugs: Secondary | ICD-10-CM

## 2021-11-17 DIAGNOSIS — E7849 Other hyperlipidemia: Secondary | ICD-10-CM | POA: Diagnosis not present

## 2021-11-17 DIAGNOSIS — E7800 Pure hypercholesterolemia, unspecified: Secondary | ICD-10-CM

## 2021-11-17 DIAGNOSIS — E669 Obesity, unspecified: Secondary | ICD-10-CM

## 2021-11-17 DIAGNOSIS — Z6830 Body mass index (BMI) 30.0-30.9, adult: Secondary | ICD-10-CM

## 2021-11-18 LAB — LIPID PANEL WITH LDL/HDL RATIO
Cholesterol, Total: 225 mg/dL — ABNORMAL HIGH (ref 100–199)
HDL: 81 mg/dL (ref >39)
LDL Chol Calc (NIH): 130 mg/dL — ABNORMAL HIGH (ref 0–99)
LDL/HDL Ratio: 1.6 ratio (ref 0.0–3.2)
Triglycerides: 82 mg/dL (ref 0–149)
VLDL Cholesterol Cal: 14 mg/dL (ref 5–40)

## 2021-11-18 LAB — CMP14+EGFR
ALT: 20 IU/L (ref 0–32)
AST: 17 IU/L (ref 0–40)
Albumin/Globulin Ratio: 2.3 — ABNORMAL HIGH (ref 1.2–2.2)
Albumin: 4.3 g/dL (ref 3.8–4.9)
Alkaline Phosphatase: 86 IU/L (ref 44–121)
BUN/Creatinine Ratio: 22 (ref 9–23)
BUN: 17 mg/dL (ref 6–24)
Bilirubin Total: 0.3 mg/dL (ref 0.0–1.2)
CO2: 22 mmol/L (ref 20–29)
Calcium: 9 mg/dL (ref 8.7–10.2)
Chloride: 105 mmol/L (ref 96–106)
Creatinine, Ser: 0.77 mg/dL (ref 0.57–1.00)
Globulin, Total: 1.9 g/dL (ref 1.5–4.5)
Glucose: 78 mg/dL (ref 70–99)
Potassium: 4.8 mmol/L (ref 3.5–5.2)
Sodium: 145 mmol/L — ABNORMAL HIGH (ref 134–144)
Total Protein: 6.2 g/dL (ref 6.0–8.5)
eGFR: 91 mL/min/{1.73_m2} (ref >59)

## 2021-11-18 LAB — HEMOGLOBIN A1C
Est. average glucose Bld gHb Est-mCnc: 120 mg/dL
Hgb A1c MFr Bld: 5.8 % — ABNORMAL HIGH (ref 4.8–5.6)

## 2021-11-18 LAB — INSULIN, RANDOM: INSULIN: 7.3 u[IU]/mL (ref 2.6–24.9)

## 2021-11-18 LAB — VITAMIN D 25 HYDROXY (VIT D DEFICIENCY, FRACTURES): Vit D, 25-Hydroxy: 44.6 ng/mL (ref 30.0–100.0)

## 2021-11-24 NOTE — Progress Notes (Unsigned)
Chief Complaint:   OBESITY Ariel Ellis is here to discuss her progress with her obesity treatment plan along with follow-up of her obesity related diagnoses. Ariel Ellis is on the Category 1 Plan and states she is following her eating plan approximately 95% of the time. Ariel Ellis states she is on the treadmill, doing squats, and weights for 60 minutes 5 times per week.  Today's visit was #: 20 Starting weight: 217 lbs Starting date: 02/22/2018 Today's weight: 184 lbs Today's date: 11/17/2021 Total lbs lost to date: 33 Total lbs lost since last in-office visit: 1  Interim History: Ariel Ellis did more traveling and social eating but despite this. She continues to do well with weight loss. She is eating more fruits and vegetables, and she is exercising regularly.   Subjective:   1. Prediabetes Ariel Ellis is on metformin and she is doing well with her diet. She notes no morning hunger currently.  2. Vitamin D deficiency Ariel Ellis's last Vitamin D level was at goal, and she is on multivitamins.   3. Other hyperlipidemia Ariel Ellis is working on decreasing cholesterol in her diet. She is not on a statin.   Assessment/Plan:   1. Prediabetes We will check labs today. Perina will continue metformin, and we will follow-up at her next visit.  - Hemoglobin A1c - Insulin, random  2. Vitamin D deficiency We will check labs today. Akiva will follow-up for routine testing of Vitamin D, at least 2-3 times per year to avoid over-replacement.  - VITAMIN D 25 Hydroxy (Vit-D Deficiency, Fractures)  3. Other hyperlipidemia We will check labs today, and we will follow-up at her next visit. Ariel Ellis will continue to work on diet, exercise and weight loss efforts. Orders and follow up as documented in patient record.   - CMP14+EGFR - Lipid Panel With LDL/HDL Ratio  4. Obesity, Current BMI 30.7 Ariel Ellis is currently in the action stage of change. As such, her goal is to continue with weight loss efforts. She has agreed to following a lower  carbohydrate, vegetable and lean protein rich diet plan.   Exercise goals: As is.   Behavioral modification strategies: increasing lean protein intake.  Ariel Ellis has agreed to follow-up with our clinic in 3 weeks. She was informed of the importance of frequent follow-up visits to maximize her success with intensive lifestyle modifications for her multiple health conditions.   Ariel Ellis was informed we would discuss her lab results at her next visit unless there is a critical issue that needs to be addressed sooner. Ariel Ellis agreed to keep her next visit at the agreed upon time to discuss these results.  Objective:   Blood pressure 101/67, pulse 70, temperature 97.7 F (36.5 C), height $RemoveBe'5\' 5"'[id number]$  (1.651 m), weight 184 lb (83.5 kg), last menstrual period 04/05/2012, SpO2 96 %. Body mass index is 30.62 kg/m.  General: Cooperative, alert, well developed, in no acute distress. HEENT: Conjunctivae and lids unremarkable. Cardiovascular: Regular rhythm.  Lungs: Normal work of breathing. Neurologic: No focal deficits.   Lab Results  Component Value Date   CREATININE 0.77 11/17/2021   BUN 17 11/17/2021   NA 145 (H) 11/17/2021   K 4.8 11/17/2021   CL 105 11/17/2021   CO2 22 11/17/2021   Lab Results  Component Value Date   ALT 20 11/17/2021   AST 17 11/17/2021   ALKPHOS 86 11/17/2021   BILITOT 0.3 11/17/2021   Lab Results  Component Value Date   HGBA1C 5.8 (H) 11/17/2021   HGBA1C 5.8 (H) 06/30/2021  HGBA1C 5.7 (H) 11/25/2020   HGBA1C 5.6 05/19/2020   HGBA1C 5.9 (H) 12/31/2019   Lab Results  Component Value Date   INSULIN 7.3 11/17/2021   INSULIN 4.9 06/30/2021   INSULIN 7.1 11/25/2020   INSULIN 4.4 05/19/2020   INSULIN 5.9 12/31/2019   Lab Results  Component Value Date   TSH 1.900 06/30/2021   Lab Results  Component Value Date   CHOL 225 (H) 11/17/2021   HDL 81 11/17/2021   LDLCALC 130 (H) 11/17/2021   TRIG 82 11/17/2021   CHOLHDL 3.5 01/06/2018   Lab Results  Component Value Date    VD25OH 44.6 11/17/2021   VD25OH 72.1 06/30/2021   VD25OH 57.8 11/25/2020   Lab Results  Component Value Date   WBC 5.8 01/06/2018   HGB 13.5 01/06/2018   HCT 41.2 01/06/2018   MCV 86 01/06/2018   PLT 272 01/06/2018   No results found for: "IRON", "TIBC", "FERRITIN"  Attestation Statements:   Reviewed by clinician on day of visit: allergies, medications, problem list, medical history, surgical history, family history, social history, and previous encounter notes.   I, Trixie Dredge, am acting as transcriptionist for Dennard Nip, MD.  I have reviewed the above documentation for accuracy and completeness, and I agree with the above. -  Dennard Nip, MD

## 2021-12-15 ENCOUNTER — Ambulatory Visit (INDEPENDENT_AMBULATORY_CARE_PROVIDER_SITE_OTHER): Payer: BC Managed Care – PPO | Admitting: Family Medicine

## 2021-12-15 ENCOUNTER — Encounter (INDEPENDENT_AMBULATORY_CARE_PROVIDER_SITE_OTHER): Payer: Self-pay | Admitting: Family Medicine

## 2021-12-15 VITALS — BP 117/71 | HR 66 | Temp 98.2°F | Ht 65.0 in | Wt 186.0 lb

## 2021-12-15 DIAGNOSIS — E7800 Pure hypercholesterolemia, unspecified: Secondary | ICD-10-CM | POA: Diagnosis not present

## 2021-12-15 DIAGNOSIS — Z6831 Body mass index (BMI) 31.0-31.9, adult: Secondary | ICD-10-CM

## 2021-12-15 DIAGNOSIS — R7303 Prediabetes: Secondary | ICD-10-CM | POA: Diagnosis not present

## 2021-12-15 DIAGNOSIS — E559 Vitamin D deficiency, unspecified: Secondary | ICD-10-CM | POA: Diagnosis not present

## 2021-12-15 DIAGNOSIS — E669 Obesity, unspecified: Secondary | ICD-10-CM | POA: Diagnosis not present

## 2021-12-15 MED ORDER — METFORMIN HCL 500 MG PO TABS: 500.0000 mg | ORAL_TABLET | Freq: Two times a day (BID) | ORAL | 0 refills | Status: DC

## 2021-12-21 NOTE — Progress Notes (Unsigned)
Chief Complaint:   OBESITY Ariel Ellis is here to discuss her progress with her obesity treatment plan along with follow-up of her obesity related diagnoses. Ariel Ellis is on following a lower carbohydrate, vegetable and lean protein rich diet plan and states she is following her eating plan approximately 98% of the time. Ariel Ellis states she is walking and lifting weights for 60-90 minutes 6 times per week.  Today's visit was #: 104 Starting weight: 217 lbs Starting date: 02/22/2018 Today's weight: 186 lbs Today's date: 12/15/2021 Total lbs lost to date: 31 Total lbs lost since last in-office visit: 0  Interim History: Ariel Ellis continues to work on decreasing simple carbohydrates, but she is still adding little things like fruit, which is stopping her weight loss on a low carbohydrate plan.  She is open to looking at other options.  Subjective:   1. Pure hypercholesterolemia Ariel Ellis last LDL was elevated.  She is not on statin and she is decreasing cholesterol in her diet, but her level is still not at goal.  I discussed labs with the patient today.  2. Vitamin D deficiency Ariel Ellis last vitamin D was low.  She is not on vitamin D as much recently.  I discussed labs with the patient today.  3. Pre-diabetes Ariel Ellis is missing her metformin doses occasionally.  Her last A1c was still slightly elevated.  I discussed labs with the patient today.  Assessment/Plan:   1. Pure hypercholesterolemia Ariel Ellis elevated LDL may be genetic.  She will continue with her diet and discuss with her PCP further.  2. Vitamin D deficiency Ariel Ellis agreed to start OTC vitamin D 2000 units daily, and we will recheck labs in 3 months.  3. Pre-diabetes Ariel Ellis agreed to increase metformin to 500 mg twice daily, and we will refill for 1 month.  - metFORMIN (GLUCOPHAGE) 500 MG tablet; Take 1 tablet (500 mg total) by mouth 2 (two) times daily with a meal.  Dispense: 60 tablet; Refill: 0  4. Obesity, Current BMI 31.0 Ariel Ellis is currently in the  action stage of change. As such, her goal is to continue with weight loss efforts. She has agreed to change to keeping a food journal and adhering to recommended goals of 1200-1400 calories and 80+ grams of protein daily.   Exercise goals: As is.   Behavioral modification strategies: increasing lean protein intake and keeping a strict food journal.  Ariel Ellis has agreed to follow-up with our clinic in 4 weeks. She was informed of the importance of frequent follow-up visits to maximize her success with intensive lifestyle modifications for her multiple health conditions.   Objective:   Blood pressure 117/71, pulse 66, temperature 98.2 F (36.8 C), height 5\' 5"  (1.651 m), weight 186 lb (84.4 kg), last menstrual period 04/05/2012, SpO2 97 %. Body mass index is 30.95 kg/m.  General: Cooperative, alert, well developed, in no acute distress. HEENT: Conjunctivae and lids unremarkable. Cardiovascular: Regular rhythm.  Lungs: Normal work of breathing. Neurologic: No focal deficits.   Lab Results  Component Value Date   CREATININE 0.77 11/17/2021   BUN 17 11/17/2021   NA 145 (H) 11/17/2021   K 4.8 11/17/2021   CL 105 11/17/2021   CO2 22 11/17/2021   Lab Results  Component Value Date   ALT 20 11/17/2021   AST 17 11/17/2021   ALKPHOS 86 11/17/2021   BILITOT 0.3 11/17/2021   Lab Results  Component Value Date   HGBA1C 5.8 (H) 11/17/2021   HGBA1C 5.8 (H) 06/30/2021  HGBA1C 5.7 (H) 11/25/2020   HGBA1C 5.6 05/19/2020   HGBA1C 5.9 (H) 12/31/2019   Lab Results  Component Value Date   INSULIN 7.3 11/17/2021   INSULIN 4.9 06/30/2021   INSULIN 7.1 11/25/2020   INSULIN 4.4 05/19/2020   INSULIN 5.9 12/31/2019   Lab Results  Component Value Date   TSH 1.900 06/30/2021   Lab Results  Component Value Date   CHOL 225 (H) 11/17/2021   HDL 81 11/17/2021   LDLCALC 130 (H) 11/17/2021   TRIG 82 11/17/2021   CHOLHDL 3.5 01/06/2018   Lab Results  Component Value Date   VD25OH 44.6  11/17/2021   VD25OH 72.1 06/30/2021   VD25OH 57.8 11/25/2020   Lab Results  Component Value Date   WBC 5.8 01/06/2018   HGB 13.5 01/06/2018   HCT 41.2 01/06/2018   MCV 86 01/06/2018   PLT 272 01/06/2018   No results found for: "IRON", "TIBC", "FERRITIN"  Attestation Statements:   Reviewed by clinician on day of visit: allergies, medications, problem list, medical history, surgical history, family history, social history, and previous encounter notes.  I have personally spent 44 minutes total time today in preparation, patient care, and documentation for this visit, including the following: review of clinical lab tests; review of medical tests/procedures/services.   I, Burt Knack, am acting as transcriptionist for Quillian Quince, MD.  I have reviewed the above documentation for accuracy and completeness, and I agree with the above. -  Quillian Quince, MD

## 2022-01-08 ENCOUNTER — Other Ambulatory Visit (INDEPENDENT_AMBULATORY_CARE_PROVIDER_SITE_OTHER): Payer: Self-pay | Admitting: Family Medicine

## 2022-01-08 DIAGNOSIS — R7303 Prediabetes: Secondary | ICD-10-CM

## 2022-01-12 ENCOUNTER — Ambulatory Visit (INDEPENDENT_AMBULATORY_CARE_PROVIDER_SITE_OTHER): Payer: BC Managed Care – PPO | Admitting: Family Medicine

## 2022-01-12 ENCOUNTER — Encounter (INDEPENDENT_AMBULATORY_CARE_PROVIDER_SITE_OTHER): Payer: Self-pay | Admitting: Family Medicine

## 2022-01-12 VITALS — BP 112/72 | HR 70 | Temp 98.1°F | Ht 65.0 in | Wt 184.0 lb

## 2022-01-12 DIAGNOSIS — E669 Obesity, unspecified: Secondary | ICD-10-CM

## 2022-01-12 DIAGNOSIS — R7303 Prediabetes: Secondary | ICD-10-CM | POA: Diagnosis not present

## 2022-01-12 DIAGNOSIS — Z6830 Body mass index (BMI) 30.0-30.9, adult: Secondary | ICD-10-CM | POA: Diagnosis not present

## 2022-01-12 MED ORDER — METFORMIN HCL 500 MG PO TABS: 500.0000 mg | ORAL_TABLET | Freq: Two times a day (BID) | ORAL | 0 refills | Status: DC

## 2022-01-18 NOTE — Progress Notes (Unsigned)
Chief Complaint:   OBESITY Ariel Ellis is here to discuss her progress with her obesity treatment plan along with follow-up of her obesity related diagnoses. Ariel Ellis is on keeping a food journal and adhering to recommended goals of 1200-1400 calories and 80+ grams of protein daily and states she is following her eating plan approximately 100% of the time. Ariel Ellis states she is walking, lifting weights, and doing squats for 60-90 minutes 6 times per week.  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 Her hunger is controlled.   Subjective:   1. Pre-diabetes Ariel Ellis has questions about how metformin works for diabetes mellitus prevention and for weight loss.   Assessment/Plan:   1. Pre-diabetes We will refill metformin for 1 month. Ariel Ellis was educated on metformin and it's mechanism of action. She will continue to work on her diet and weight loss.   - metFORMIN (GLUCOPHAGE) 500 MG tablet; Take 1 tablet (500 mg total) by mouth 2 (two) times daily with a meal.  Dispense: 60 tablet; Refill: 0  2. Obesity, Current BMI 30.7 Ariel Ellis is currently in the action stage of change. As such, her goal is to continue with weight loss efforts. She has agreed to keeping a food journal and adhering to recommended goals of 1200-1400 calories and 80+ grams of protein daily.   Exercise goals: As is.   Behavioral modification strategies: increasing lean protein intake.  Ariel Ellis has agreed to follow-up with our clinic in 4 weeks. She was informed of the importance of frequent follow-up visits to maximize her success with intensive lifestyle modifications for her multiple health conditions.   Objective:   Blood pressure 112/72, pulse 70, temperature 98.1 F (36.7 C),  height 5\' 5"  (1.651 m), weight 184 lb (83.5 kg), last menstrual period 04/05/2012, SpO2 97 %. Body mass index is 30.62 kg/m.  General: Cooperative, alert, well developed, in no acute distress. HEENT: Conjunctivae and lids unremarkable. Cardiovascular: Regular rhythm.  Lungs: Normal work of breathing. Neurologic: No focal deficits.   Lab Results  Component Value Date   CREATININE 0.77 11/17/2021   BUN 17 11/17/2021   NA 145 (H) 11/17/2021   K 4.8 11/17/2021   CL 105 11/17/2021   CO2 22 11/17/2021   Lab Results  Component Value Date   ALT 20 11/17/2021   AST 17 11/17/2021   ALKPHOS 86 11/17/2021   BILITOT 0.3 11/17/2021   Lab Results  Component Value Date   HGBA1C 5.8 (H) 11/17/2021   HGBA1C 5.8 (H) 06/30/2021   HGBA1C 5.7 (H) 11/25/2020   HGBA1C 5.6 05/19/2020   HGBA1C 5.9 (H) 12/31/2019   Lab Results  Component Value Date   INSULIN 7.3 11/17/2021   INSULIN 4.9 06/30/2021   INSULIN 7.1 11/25/2020   INSULIN 4.4 05/19/2020   INSULIN 5.9 12/31/2019   Lab Results  Component Value Date   TSH 1.900 06/30/2021   Lab Results  Component Value Date   CHOL 225 (H) 11/17/2021   HDL 81 11/17/2021   LDLCALC 130 (H) 11/17/2021   TRIG 82 11/17/2021   CHOLHDL 3.5 01/06/2018   Lab Results  Component Value Date   VD25OH 44.6 11/17/2021   VD25OH 72.1 06/30/2021   VD25OH 57.8 11/25/2020   Lab  Results  Component Value Date   WBC 5.8 01/06/2018   HGB 13.5 01/06/2018   HCT 41.2 01/06/2018   MCV 86 01/06/2018   PLT 272 01/06/2018   No results found for: "IRON", "TIBC", "FERRITIN"  Attestation Statements:   Reviewed by clinician on day of visit: allergies, medications, problem list, medical history, surgical history, family history, social history, and previous encounter notes.  Time spent on visit including pre-visit chart review and post-visit care and charting was 30 minutes.   I, Trixie Dredge, am acting as transcriptionist for Dennard Nip, MD.  I have  reviewed the above documentation for accuracy and completeness, and I agree with the above. -  Dennard Nip, MD

## 2022-02-09 ENCOUNTER — Encounter (INDEPENDENT_AMBULATORY_CARE_PROVIDER_SITE_OTHER): Payer: Self-pay | Admitting: Family Medicine

## 2022-02-09 ENCOUNTER — Ambulatory Visit (INDEPENDENT_AMBULATORY_CARE_PROVIDER_SITE_OTHER): Payer: BC Managed Care – PPO | Admitting: Family Medicine

## 2022-02-09 VITALS — BP 113/71 | HR 68 | Temp 98.1°F | Ht 65.0 in | Wt 182.0 lb

## 2022-02-09 DIAGNOSIS — Z6830 Body mass index (BMI) 30.0-30.9, adult: Secondary | ICD-10-CM | POA: Diagnosis not present

## 2022-02-09 DIAGNOSIS — R7303 Prediabetes: Secondary | ICD-10-CM | POA: Diagnosis not present

## 2022-02-09 DIAGNOSIS — E7849 Other hyperlipidemia: Secondary | ICD-10-CM

## 2022-02-09 DIAGNOSIS — E669 Obesity, unspecified: Secondary | ICD-10-CM | POA: Diagnosis not present

## 2022-02-09 MED ORDER — METFORMIN HCL 500 MG PO TABS: 500.0000 mg | ORAL_TABLET | Freq: Two times a day (BID) | ORAL | 0 refills | Status: DC

## 2022-02-17 NOTE — Progress Notes (Signed)
Chief Complaint:   OBESITY Ariel Ellis is here to discuss her progress with her obesity treatment plan along with follow-up of her obesity related diagnoses. Ariel Ellis is on keeping a food journal and adhering to recommended goals of 1200-1400 calories and 80+ grams of protein daily and states she is following her eating plan approximately 95% of the time. Ariel Ellis states she is walking and lifting weights for 60 minutes 6 times per week.  Today's visit was #: 59 Starting weight: 217 lbs Starting date: 02/22/2018 Today's weight: 182 lbs Today's date: 02/09/2022 Total lbs lost to date: 35 Total lbs lost since last in-office visit: 2  Interim History: Ariel Ellis has done well with her weight loss. She is going to [place] for her anniversary soon.   Subjective:   1. Pre-diabetes Ariel Ellis is taking metformin with no side effects. She is trying to increase to BID dosing as she had some increased stressors with the loss of her brother in-law recently.   2. Other hyperlipidemia Ariel Ellis's LDL is not at goal. She is not taking a statin.   Assessment/Plan:   1. Pre-diabetes Ariel Ellis will continue metformin, diet, and exercise. We will refill metformin for 1 month, and we will recheck labs at her next visit.   - metFORMIN (GLUCOPHAGE) 500 MG tablet; Take 1 tablet (500 mg total) by mouth 2 (two) times daily with a meal.  Dispense: 60 tablet; Refill: 0  2. Other hyperlipidemia Ariel Ellis will continue her healthy eating plan and exercise. We will check labs at her next visit.   3. Obesity, Current BMI 30.4 Ariel Ellis is currently in the action stage of change. As such, her goal is to continue with weight loss efforts. She has agreed to keeping a food journal and adhering to recommended goals of 1200-1400 calories and 80+ grams of protein daily.   We will recheck fasting labs at her next visit.   Exercise goals: As is.   Behavioral modification strategies: increasing lean protein intake, decreasing simple carbohydrates, holiday  eating strategies , and celebration eating strategies.  Ariel Ellis has agreed to follow-up with our clinic in 4 weeks. She was informed of the importance of frequent follow-up visits to maximize her success with intensive lifestyle modifications for her multiple health conditions.   Objective:   Blood pressure 113/71, pulse 68, temperature 98.1 F (36.7 C), height 5\' 5"  (1.651 m), weight 182 lb (82.6 kg), last menstrual period 04/05/2012, SpO2 98 %. Body mass index is 30.29 kg/m.  General: Cooperative, alert, well developed, in no acute distress. HEENT: Conjunctivae and lids unremarkable. Cardiovascular: Regular rhythm.  Lungs: Normal work of breathing. Neurologic: No focal deficits.   Lab Results  Component Value Date   CREATININE 0.77 11/17/2021   BUN 17 11/17/2021   NA 145 (H) 11/17/2021   K 4.8 11/17/2021   CL 105 11/17/2021   CO2 22 11/17/2021   Lab Results  Component Value Date   ALT 20 11/17/2021   AST 17 11/17/2021   ALKPHOS 86 11/17/2021   BILITOT 0.3 11/17/2021   Lab Results  Component Value Date   HGBA1C 5.8 (H) 11/17/2021   HGBA1C 5.8 (H) 06/30/2021   HGBA1C 5.7 (H) 11/25/2020   HGBA1C 5.6 05/19/2020   HGBA1C 5.9 (H) 12/31/2019   Lab Results  Component Value Date   INSULIN 7.3 11/17/2021   INSULIN 4.9 06/30/2021   INSULIN 7.1 11/25/2020   INSULIN 4.4 05/19/2020   INSULIN 5.9 12/31/2019   Lab Results  Component Value Date  TSH 1.900 06/30/2021   Lab Results  Component Value Date   CHOL 225 (H) 11/17/2021   HDL 81 11/17/2021   LDLCALC 130 (H) 11/17/2021   TRIG 82 11/17/2021   CHOLHDL 3.5 01/06/2018   Lab Results  Component Value Date   VD25OH 44.6 11/17/2021   VD25OH 72.1 06/30/2021   VD25OH 57.8 11/25/2020   Lab Results  Component Value Date   WBC 5.8 01/06/2018   HGB 13.5 01/06/2018   HCT 41.2 01/06/2018   MCV 86 01/06/2018   PLT 272 01/06/2018   No results found for: "IRON", "TIBC", "FERRITIN"  Attestation Statements:   Reviewed by  clinician on day of visit: allergies, medications, problem list, medical history, surgical history, family history, social history, and previous encounter notes.   I, Burt Knack, am acting as transcriptionist for Quillian Quince, MD.  I have reviewed the above documentation for accuracy and completeness, and I agree with the above. -  Quillian Quince, MD

## 2022-03-11 ENCOUNTER — Encounter (INDEPENDENT_AMBULATORY_CARE_PROVIDER_SITE_OTHER): Payer: Self-pay | Admitting: Physician Assistant

## 2022-03-11 ENCOUNTER — Ambulatory Visit (INDEPENDENT_AMBULATORY_CARE_PROVIDER_SITE_OTHER): Payer: BC Managed Care – PPO | Admitting: Physician Assistant

## 2022-03-11 VITALS — BP 106/72 | HR 76 | Temp 98.5°F | Ht 65.0 in | Wt 179.0 lb

## 2022-03-11 DIAGNOSIS — E669 Obesity, unspecified: Secondary | ICD-10-CM

## 2022-03-11 DIAGNOSIS — E7849 Other hyperlipidemia: Secondary | ICD-10-CM | POA: Diagnosis not present

## 2022-03-11 DIAGNOSIS — Z6836 Body mass index (BMI) 36.0-36.9, adult: Secondary | ICD-10-CM

## 2022-03-11 DIAGNOSIS — Z6829 Body mass index (BMI) 29.0-29.9, adult: Secondary | ICD-10-CM

## 2022-03-11 DIAGNOSIS — R5383 Other fatigue: Secondary | ICD-10-CM

## 2022-03-11 DIAGNOSIS — R7303 Prediabetes: Secondary | ICD-10-CM

## 2022-03-11 DIAGNOSIS — E559 Vitamin D deficiency, unspecified: Secondary | ICD-10-CM | POA: Diagnosis not present

## 2022-03-12 LAB — CBC WITH DIFFERENTIAL/PLATELET
Basophils Absolute: 0 10*3/uL (ref 0.0–0.2)
Basos: 0 %
EOS (ABSOLUTE): 0.3 10*3/uL (ref 0.0–0.4)
Eos: 8 %
Hematocrit: 40.9 % (ref 34.0–46.6)
Hemoglobin: 13.2 g/dL (ref 11.1–15.9)
Immature Grans (Abs): 0 10*3/uL (ref 0.0–0.1)
Immature Granulocytes: 0 %
Lymphocytes Absolute: 0.9 10*3/uL (ref 0.7–3.1)
Lymphs: 22 %
MCH: 28.6 pg (ref 26.6–33.0)
MCHC: 32.3 g/dL (ref 31.5–35.7)
MCV: 89 fL (ref 79–97)
Monocytes Absolute: 0.3 10*3/uL (ref 0.1–0.9)
Monocytes: 9 %
Neutrophils Absolute: 2.4 10*3/uL (ref 1.4–7.0)
Neutrophils: 61 %
Platelets: 230 10*3/uL (ref 150–450)
RBC: 4.62 x10E6/uL (ref 3.77–5.28)
RDW: 13.2 % (ref 11.7–15.4)
WBC: 4 10*3/uL (ref 3.4–10.8)

## 2022-03-12 LAB — CMP14+EGFR
ALT: 16 IU/L (ref 0–32)
AST: 18 IU/L (ref 0–40)
Albumin/Globulin Ratio: 2.5 — ABNORMAL HIGH (ref 1.2–2.2)
Albumin: 4.5 g/dL (ref 3.8–4.9)
Alkaline Phosphatase: 76 IU/L (ref 44–121)
BUN/Creatinine Ratio: 21 (ref 9–23)
BUN: 17 mg/dL (ref 6–24)
Bilirubin Total: 0.3 mg/dL (ref 0.0–1.2)
CO2: 24 mmol/L (ref 20–29)
Calcium: 9 mg/dL (ref 8.7–10.2)
Chloride: 105 mmol/L (ref 96–106)
Creatinine, Ser: 0.81 mg/dL (ref 0.57–1.00)
Globulin, Total: 1.8 g/dL (ref 1.5–4.5)
Glucose: 86 mg/dL (ref 70–99)
Potassium: 4.3 mmol/L (ref 3.5–5.2)
Sodium: 142 mmol/L (ref 134–144)
Total Protein: 6.3 g/dL (ref 6.0–8.5)
eGFR: 85 mL/min/{1.73_m2} (ref >59)

## 2022-03-12 LAB — LIPID PANEL WITH LDL/HDL RATIO
Cholesterol, Total: 220 mg/dL — ABNORMAL HIGH (ref 100–199)
HDL: 88 mg/dL (ref >39)
LDL Chol Calc (NIH): 120 mg/dL — ABNORMAL HIGH (ref 0–99)
LDL/HDL Ratio: 1.4 ratio (ref 0.0–3.2)
Triglycerides: 69 mg/dL (ref 0–149)
VLDL Cholesterol Cal: 12 mg/dL (ref 5–40)

## 2022-03-12 LAB — INSULIN, RANDOM: INSULIN: 4.5 u[IU]/mL (ref 2.6–24.9)

## 2022-03-12 LAB — VITAMIN D 25 HYDROXY (VIT D DEFICIENCY, FRACTURES): Vit D, 25-Hydroxy: 56.6 ng/mL (ref 30.0–100.0)

## 2022-03-12 LAB — VITAMIN B12: Vitamin B-12: 559 pg/mL (ref 232–1245)

## 2022-03-12 LAB — HEMOGLOBIN A1C
Est. average glucose Bld gHb Est-mCnc: 114 mg/dL
Hgb A1c MFr Bld: 5.6 % (ref 4.8–5.6)

## 2022-03-12 LAB — TSH: TSH: 2.28 u[IU]/mL (ref 0.450–4.500)

## 2022-03-16 DIAGNOSIS — L03113 Cellulitis of right upper limb: Secondary | ICD-10-CM | POA: Diagnosis not present

## 2022-03-16 DIAGNOSIS — S61451A Open bite of right hand, initial encounter: Secondary | ICD-10-CM | POA: Diagnosis not present

## 2022-03-23 ENCOUNTER — Other Ambulatory Visit (INDEPENDENT_AMBULATORY_CARE_PROVIDER_SITE_OTHER): Payer: Self-pay | Admitting: Family Medicine

## 2022-03-23 DIAGNOSIS — R7303 Prediabetes: Secondary | ICD-10-CM

## 2022-03-25 NOTE — Progress Notes (Signed)
Chief Complaint:   OBESITY Ariel Ellis is here to discuss her progress with her obesity treatment plan along with follow-up of her obesity related diagnoses. Tashi is on keeping a food journal and adhering to recommended goals of 1400 calories and 80 grams of protein and states she is following her eating plan approximately 75% of the time. Ilyana states she is walking/squats/weights 60/40 minutes 3-5 times per week.  Today's visit was #: 61 Starting weight: 217 lbs Starting date: 02/22/2018 Today's weight: 179 lbs Today's date: 03/11/2022 Total lbs lost to date: 38 lbs Total lbs lost since last in-office visit: 3  Interim History: Waverly has done well with weight loss. Enjoyed holiday and working to get back on track with eating plan/exercise. Has done well with journaling.  Subjective:   1. Pre-diabetes Taking Metformin 500 mg twice a day. Denies any side effects. Missed some doses over the holiday. A1c at 5.8/insulin at 7.3 on 11/17/21. Working on healthy eating plan decreasing simple carbohydrates, decreasing saturated fats, increasing lean proteins and exercise to promote weight loss.  2. Other hyperlipidemia Not on statin--working on healthy eating plan decreasing simple carbohydrates, decreasing saturated fats, increasing lean proteins and exercise to promote weight loss.  3. Vitamin D deficiency Vit d level of 44.6 on 11/17/21. Not taking Vit D currently. Notes fatigue.   4. Other fatigue Some family history of thyroid: sister/ mother. Notes fatigue at times.  Assessment/Plan:   1. Pre-diabetes We will obtain labs today. Continue healthy eating plan and exercise.  - CMP14+EGFR - Hemoglobin A1c - Insulin, random  2. Other hyperlipidemia We will obtain labs today.   - Lipid Panel With LDL/HDL Ratio  3. Vitamin D deficiency We will obtain labs today and supplement as indicated.  - VITAMIN D 25 Hydroxy (Vit-D Deficiency, Fractures)  4. Other fatigue We will obtain labs  today. Continue healthy eating plan and exercise.  - Vitamin B12 - CBC with Differential/Platelet - TSH  5. Obesity, Current BMI 29.9 Chayce is currently in the action stage of change. As such, her goal is to continue with weight loss efforts. She has agreed to keeping a food journal and adhering to recommended goals of 1400 calories and 80+ grams of protein.   Exercise goals: As is.  Behavioral modification strategies: increasing lean protein intake, decreasing simple carbohydrates, travel eating strategies, and holiday eating strategies .  Shekela has agreed to follow-up with our clinic in 4 weeks. She was informed of the importance of frequent follow-up visits to maximize her success with intensive lifestyle modifications for her multiple health conditions.   Tamra was informed we would discuss her lab results at her next visit unless there is a critical issue that needs to be addressed sooner. Minnetta agreed to keep her next visit at the agreed upon time to discuss these results.  Objective:   Blood pressure 106/72, pulse 76, temperature 98.5 F (36.9 C), height _0  (1.651 m), weight 179 lb (81.2 kg), last menstrual period 04/05/2012, SpO2 96 %. Body mass index is 29.79 kg/m.  General: Cooperative, alert, well developed, in no acute distress. HEENT: Conjunctivae and lids unremarkable. Cardiovascular: Regular rhythm.  Lungs: Normal work of breathing. Neurologic: No focal deficits.   Lab Results  Component Value Date   CREATININE 0.81 03/11/2022   BUN 17 03/11/2022   NA 142 03/11/2022   K 4.3 03/11/2022   CL 105 03/11/2022   CO2 24 03/11/2022   Lab Results  Component Value Date  ALT 16 03/11/2022   AST 18 03/11/2022   ALKPHOS 76 03/11/2022   BILITOT 0.3 03/11/2022   Lab Results  Component Value Date   HGBA1C 5.6 03/11/2022   HGBA1C 5.8 (H) 11/17/2021   HGBA1C 5.8 (H) 06/30/2021   HGBA1C 5.7 (H) 11/25/2020   HGBA1C 5.6 05/19/2020   Lab Results  Component Value Date    INSULIN 4.5 03/11/2022   INSULIN 7.3 11/17/2021   INSULIN 4.9 06/30/2021   INSULIN 7.1 11/25/2020   INSULIN 4.4 05/19/2020   Lab Results  Component Value Date   TSH 2.280 03/11/2022   Lab Results  Component Value Date   CHOL 220 (H) 03/11/2022   HDL 88 03/11/2022   LDLCALC 120 (H) 03/11/2022   TRIG 69 03/11/2022   CHOLHDL 3.5 01/06/2018   Lab Results  Component Value Date   VD25OH 56.6 03/11/2022   VD25OH 44.6 11/17/2021   VD25OH 72.1 06/30/2021   Lab Results  Component Value Date   WBC 4.0 03/11/2022   HGB 13.2 03/11/2022   HCT 40.9 03/11/2022   MCV 89 03/11/2022   PLT 230 03/11/2022   No results found for: "IRON", "TIBC", "FERRITIN"  Attestation Statements:   Reviewed by clinician on day of visit: allergies, medications, problem list, medical history, surgical history, family history, social history, and previous encounter notes.  I, Brendell Tyus, am acting as transcriptionist for [employer], [employer].  I have reviewed the above documentation for accuracy and completeness, and I agree with the above. -  Vernis Eid,PA-C

## 2022-04-08 ENCOUNTER — Ambulatory Visit (INDEPENDENT_AMBULATORY_CARE_PROVIDER_SITE_OTHER): Payer: BLUE CROSS/BLUE SHIELD | Admitting: Family Medicine

## 2022-04-08 ENCOUNTER — Encounter (INDEPENDENT_AMBULATORY_CARE_PROVIDER_SITE_OTHER): Payer: Self-pay | Admitting: Family Medicine

## 2022-04-08 VITALS — BP 125/70 | HR 79 | Temp 98.0°F | Ht 65.0 in | Wt 187.0 lb

## 2022-04-08 DIAGNOSIS — E7800 Pure hypercholesterolemia, unspecified: Secondary | ICD-10-CM | POA: Diagnosis not present

## 2022-04-08 DIAGNOSIS — R7303 Prediabetes: Secondary | ICD-10-CM | POA: Diagnosis not present

## 2022-04-08 DIAGNOSIS — Z6831 Body mass index (BMI) 31.0-31.9, adult: Secondary | ICD-10-CM | POA: Diagnosis not present

## 2022-04-08 DIAGNOSIS — E669 Obesity, unspecified: Secondary | ICD-10-CM

## 2022-04-08 MED ORDER — METFORMIN HCL 500 MG PO TABS
500.0000 mg | ORAL_TABLET | Freq: Two times a day (BID) | ORAL | 0 refills | Status: DC
Start: 2022-04-08 — End: 2022-05-06

## 2022-04-20 NOTE — Progress Notes (Signed)
Chief Complaint:   OBESITY Ariel Ellis is here to discuss her progress with her obesity treatment plan along with follow-up of her obesity related diagnoses. Ariel Ellis is on keeping a food journal and adhering to recommended goals of 1400 calories and 80+ grams of protein and states she is following her eating plan approximately (unknown)% of the time. Ariel Ellis states she is doing 0 minutes 0 times per week.  Today's visit was #: 58 Starting weight: 217 lbs Starting date: 02/22/2018 Today's weight: 187 lbs Today's date: 04/08/2022 Total lbs lost to date: 30 Total lbs lost since last in-office visit: 0  Interim History: Ariel Ellis did some celebration eating over the holidays and she had a lot more temptations. She is ready to get back to journaling and exercise.  Subjective:   1. Pre-diabetes Ariel Ellis's fasting glucose and insulin are at goal, and her A1c has improved. She is tolerating metformin well. I discussed labs with the patient today.   2. Pure hypercholesterolemia Ariel Ellis's HDL and LDL have both improved. She has questions about LDL and diet. I discussed labs with the patient today.   Assessment/Plan:   1. Pre-diabetes Ariel Ellis will continue metformin, and we will refill for 1 month.   - metFORMIN (GLUCOPHAGE) 500 MG tablet; Take 1 tablet (500 mg total) by mouth 2 (two) times daily with a meal.  Dispense: 60 tablet; Refill: 0  2. Pure hypercholesterolemia Ariel Ellis was educated about LDL, diet, and the role of genetics.   3. Obesity, Current BMI 31.2 Ariel Ellis is currently in the action stage of change. As such, her goal is to continue with weight loss efforts. She has agreed to keeping a food journal and adhering to recommended goals of 1400 calories and 80+ grams of protein daily.   Exercise goals: Bike and strengthening for 30 minutes 5 times per week.   Behavioral modification strategies: increasing lean protein intake and travel eating strategies.  Ariel Ellis has agreed to follow-up with our clinic in 4 weeks. She  was informed of the importance of frequent follow-up visits to maximize her success with intensive lifestyle modifications for her multiple health conditions.   Objective:   Blood pressure 125/70, pulse 79, temperature 98 F (36.7 C), height 5\' 5"  (1.651 m), weight 187 lb (84.8 kg), last menstrual period 04/05/2012, SpO2 93 %. Body mass index is 31.12 kg/m.  General: Cooperative, alert, well developed, in no acute distress. HEENT: Conjunctivae and lids unremarkable. Cardiovascular: Regular rhythm.  Lungs: Normal work of breathing. Neurologic: No focal deficits.   Lab Results  Component Value Date   CREATININE 0.81 03/11/2022   BUN 17 03/11/2022   NA 142 03/11/2022   K 4.3 03/11/2022   CL 105 03/11/2022   CO2 24 03/11/2022   Lab Results  Component Value Date   ALT 16 03/11/2022   AST 18 03/11/2022   ALKPHOS 76 03/11/2022   BILITOT 0.3 03/11/2022   Lab Results  Component Value Date   HGBA1C 5.6 03/11/2022   HGBA1C 5.8 (H) 11/17/2021   HGBA1C 5.8 (H) 06/30/2021   HGBA1C 5.7 (H) 11/25/2020   HGBA1C 5.6 05/19/2020   Lab Results  Component Value Date   INSULIN 4.5 03/11/2022   INSULIN 7.3 11/17/2021   INSULIN 4.9 06/30/2021   INSULIN 7.1 11/25/2020   INSULIN 4.4 05/19/2020   Lab Results  Component Value Date   TSH 2.280 03/11/2022   Lab Results  Component Value Date   CHOL 220 (H) 03/11/2022   HDL 88 03/11/2022  LDLCALC 120 (H) 03/11/2022   TRIG 69 03/11/2022   CHOLHDL 3.5 01/06/2018   Lab Results  Component Value Date   VD25OH 56.6 03/11/2022   VD25OH 44.6 11/17/2021   VD25OH 72.1 06/30/2021   Lab Results  Component Value Date   WBC 4.0 03/11/2022   HGB 13.2 03/11/2022   HCT 40.9 03/11/2022   MCV 89 03/11/2022   PLT 230 03/11/2022   No results found for: "IRON", "TIBC", "FERRITIN"  Attestation Statements:   Reviewed by clinician on day of visit: allergies, medications, problem list, medical history, surgical history, family history, social  history, and previous encounter notes.   I, Trixie Dredge, am acting as transcriptionist for Dennard Nip, MD.  I have reviewed the above documentation for accuracy and completeness, and I agree with the above. -  Dennard Nip, MD

## 2022-05-06 ENCOUNTER — Ambulatory Visit (INDEPENDENT_AMBULATORY_CARE_PROVIDER_SITE_OTHER): Payer: BLUE CROSS/BLUE SHIELD | Admitting: Family Medicine

## 2022-05-06 ENCOUNTER — Encounter (INDEPENDENT_AMBULATORY_CARE_PROVIDER_SITE_OTHER): Payer: Self-pay | Admitting: Family Medicine

## 2022-05-06 VITALS — BP 109/72 | HR 67 | Temp 97.8°F | Ht 65.0 in | Wt 186.0 lb

## 2022-05-06 DIAGNOSIS — Z6831 Body mass index (BMI) 31.0-31.9, adult: Secondary | ICD-10-CM | POA: Diagnosis not present

## 2022-05-06 DIAGNOSIS — E669 Obesity, unspecified: Secondary | ICD-10-CM

## 2022-05-06 DIAGNOSIS — R7303 Prediabetes: Secondary | ICD-10-CM

## 2022-05-06 MED ORDER — METFORMIN HCL 500 MG PO TABS: 500.0000 mg | ORAL_TABLET | Freq: Two times a day (BID) | ORAL | 0 refills | Status: DC

## 2022-05-18 NOTE — Progress Notes (Unsigned)
Chief Complaint:   OBESITY Ariel Ellis is here to discuss her progress with her obesity treatment plan along with follow-up of her obesity related diagnoses. [name] is on keeping a food journal and adhering to recommended goals of 1400 calories and 80+ grams of protein and states she is following her eating plan approximately 50% of the time. [name] states she is walking, doing squats and lifting weights for 60-80 minutes 5 times per week.  Today's visit was #: 8 Starting weight: 217 lbs Starting date: 02/22/2018 Today's weight: 186 lbs Today's date: 05/06/2022 Total lbs lost to date: 31 Total lbs lost since last in-office visit: 1  Interim History: Ariel Ellis has been especially busy and did some traveling.  She is working on increasing protein but this was difficult with traveling.  She feels the next few weeks will be easier.  Subjective:   1. Pre-diabetes Ariel Ellis continues to work on her diet and weight loss.  She has missed some doses of metformin while being extra busy.  She needs a refill today.  Assessment/Plan:   1. Pre-diabetes Ariel Ellis will continue metformin, and we will refill for 1 month.  - metFORMIN (GLUCOPHAGE) 500 MG tablet; Take 1 tablet (500 mg total) by mouth 2 (two) times daily with a meal.  Dispense: 60 tablet; Refill: 0  2. BMI 31.0-31.9,adult  3. Obesity, Beginning BMI 36.11 [name] is currently in the action stage of change. As such, her goal is to continue with weight loss efforts. She has agreed to keeping a food journal and adhering to recommended goals of 1400 calories and 80+ grams of protein daily.   Exercise goals: As is.   Behavioral modification strategies: increasing lean protein intake.  [name] has agreed to follow-up with our clinic in 4 weeks. She was informed of the importance of frequent follow-up visits to maximize her success with intensive lifestyle modifications for her multiple health conditions.   Objective:   Blood pressure 109/72, pulse 67, temperature 97.8  F (36.6 C), height 5' 5"$  (1.651 m), weight 186 lb (84.4 kg), last menstrual period 04/05/2012, SpO2 95 %. Body mass index is 30.95 kg/m.  General: Cooperative, alert, well developed, in no acute distress. HEENT: Conjunctivae and lids unremarkable. Cardiovascular: Regular rhythm.  Lungs: Normal work of breathing. Neurologic: No focal deficits.   Lab Results  Component Value Date   CREATININE 0.81 03/11/2022   BUN 17 03/11/2022   NA 142 03/11/2022   K 4.3 03/11/2022   CL 105 03/11/2022   CO2 24 03/11/2022   Lab Results  Component Value Date   ALT 16 03/11/2022   AST 18 03/11/2022   ALKPHOS 76 03/11/2022   BILITOT 0.3 03/11/2022   Lab Results  Component Value Date   HGBA1C 5.6 03/11/2022   HGBA1C 5.8 (H) 11/17/2021   HGBA1C 5.8 (H) 06/30/2021   HGBA1C 5.7 (H) 11/25/2020   HGBA1C 5.6 05/19/2020   Lab Results  Component Value Date   INSULIN 4.5 03/11/2022   INSULIN 7.3 11/17/2021   INSULIN 4.9 06/30/2021   INSULIN 7.1 11/25/2020   INSULIN 4.4 05/19/2020   Lab Results  Component Value Date   TSH 2.280 03/11/2022   Lab Results  Component Value Date   CHOL 220 (H) 03/11/2022   HDL 88 03/11/2022   LDLCALC 120 (H) 03/11/2022   TRIG 69 03/11/2022   CHOLHDL 3.5 01/06/2018   Lab Results  Component Value Date   VD25OH 56.6 03/11/2022   VD25OH 44.6 11/17/2021   VD25OH 72.1  06/30/2021   Lab Results  Component Value Date   WBC 4.0 03/11/2022   HGB 13.2 03/11/2022   HCT 40.9 03/11/2022   MCV 89 03/11/2022   PLT 230 03/11/2022   No results found for: "IRON", "TIBC", "FERRITIN"  Attestation Statements:   Reviewed by clinician on day of visit: allergies, medications, problem list, medical history, surgical history, family history, social history, and previous encounter notes.  Time spent on visit including pre-visit chart review and post-visit care and charting was 30 minutes.   I, Trixie Dredge, am acting as transcriptionist for Dennard Nip, MD.  I have  reviewed the above documentation for accuracy and completeness, and I agree with the above. -  Dennard Nip, MD

## 2022-06-03 ENCOUNTER — Encounter (INDEPENDENT_AMBULATORY_CARE_PROVIDER_SITE_OTHER): Payer: Self-pay | Admitting: Family Medicine

## 2022-06-03 ENCOUNTER — Ambulatory Visit (INDEPENDENT_AMBULATORY_CARE_PROVIDER_SITE_OTHER): Payer: BLUE CROSS/BLUE SHIELD | Admitting: Family Medicine

## 2022-06-03 VITALS — BP 125/73 | HR 83 | Temp 98.3°F | Ht 65.0 in | Wt 186.0 lb

## 2022-06-03 DIAGNOSIS — Z6831 Body mass index (BMI) 31.0-31.9, adult: Secondary | ICD-10-CM

## 2022-06-03 DIAGNOSIS — E669 Obesity, unspecified: Secondary | ICD-10-CM | POA: Diagnosis not present

## 2022-06-03 DIAGNOSIS — E7800 Pure hypercholesterolemia, unspecified: Secondary | ICD-10-CM | POA: Diagnosis not present

## 2022-06-03 DIAGNOSIS — R7303 Prediabetes: Secondary | ICD-10-CM

## 2022-06-03 MED ORDER — METFORMIN HCL 500 MG PO TABS: 500.0000 mg | ORAL_TABLET | Freq: Two times a day (BID) | ORAL | 0 refills | Status: DC

## 2022-06-21 NOTE — Progress Notes (Signed)
Chief Complaint:   OBESITY Ariel Ellis is here to discuss her progress with her obesity treatment plan along with follow-up of her obesity related diagnoses. Ariel Ellis is on keeping a food journal and adhering to recommended goals of 1400 calories and 80+ grams of protein and states she is following her eating plan approximately 90% of the time. Ariel Ellis states she is on the treadmill and doing squats for 60 minutes 6 times per week.  Today's visit was #: 45 Starting weight: 217 lbs Starting date: 02/22/2018 Today's weight: 186 lbs Today's date: 06/03/2022 Total lbs lost to date: 31 Total lbs lost since last in-office visit: 0  Interim History: Ariel Ellis has done well with maintaining her weight loss despite extra challenges and temptations. She would like to lose another 10 lbs and she feels she is ready to get back to very strict journaling.   Subjective:   1. Pure hypercholesterolemia Ariel Ellis continues to work on decreasing cholesterol in her diet and with weight loss.  She is not on statin, and she denies chest pain.  2. Pre-diabetes Ariel Ellis is on metformin, and she is working on her diet, exercise, and weight loss.  Assessment/Plan:   1. Pure hypercholesterolemia Ariel Ellis is to continue to be mindful of her nutrition and we will keep a strict journal to help her progress and prevent heart disease.  2. Pre-diabetes Ariel Ellis will continue metformin, diet, and exercise.  We will refill metformin for 1 month, and we will recheck labs in 2 months.  - metFORMIN (GLUCOPHAGE) 500 MG tablet; Take 1 tablet (500 mg total) by mouth 2 (two) times daily with a meal.  Dispense: 60 tablet; Refill: 0  3. BMI 31.0-31.9,adult  4. Obesity, Beginning BMI 36.11 Ariel Ellis is currently in the action stage of change. As such, her goal is to continue with weight loss efforts. She has agreed to keeping a food journal and adhering to recommended goals of 1200-1400 calories and 80+ grams of protein daily.   Exercise goals: As is.    Behavioral modification strategies: increasing lean protein intake and keeping a strict food journal.  Ariel Ellis has agreed to follow-up with our clinic in 4 weeks. She was informed of the importance of frequent follow-up visits to maximize her success with intensive lifestyle modifications for her multiple health conditions.   Objective:   Blood pressure 125/73, pulse 83, temperature 98.3 F (36.8 C), height 5\' 5"  (1.651 m), weight 186 lb (84.4 kg), last menstrual period 04/05/2012, SpO2 94 %. Body mass index is 30.95 kg/m.  Lab Results  Component Value Date   CREATININE 0.81 03/11/2022   BUN 17 03/11/2022   NA 142 03/11/2022   K 4.3 03/11/2022   CL 105 03/11/2022   CO2 24 03/11/2022   Lab Results  Component Value Date   ALT 16 03/11/2022   AST 18 03/11/2022   ALKPHOS 76 03/11/2022   BILITOT 0.3 03/11/2022   Lab Results  Component Value Date   HGBA1C 5.6 03/11/2022   HGBA1C 5.8 (H) 11/17/2021   HGBA1C 5.8 (H) 06/30/2021   HGBA1C 5.7 (H) 11/25/2020   HGBA1C 5.6 05/19/2020   Lab Results  Component Value Date   INSULIN 4.5 03/11/2022   INSULIN 7.3 11/17/2021   INSULIN 4.9 06/30/2021   INSULIN 7.1 11/25/2020   INSULIN 4.4 05/19/2020   Lab Results  Component Value Date   TSH 2.280 03/11/2022   Lab Results  Component Value Date   CHOL 220 (H) 03/11/2022   HDL 88 03/11/2022  LDLCALC 120 (H) 03/11/2022   TRIG 69 03/11/2022   CHOLHDL 3.5 01/06/2018   Lab Results  Component Value Date   VD25OH 56.6 03/11/2022   VD25OH 44.6 11/17/2021   VD25OH 72.1 06/30/2021   Lab Results  Component Value Date   WBC 4.0 03/11/2022   HGB 13.2 03/11/2022   HCT 40.9 03/11/2022   MCV 89 03/11/2022   PLT 230 03/11/2022   No results found for: "IRON", "TIBC", "FERRITIN"  Attestation Statements:   Reviewed by clinician on day of visit: allergies, medications, problem list, medical history, surgical history, family history, social history, and previous encounter notes. I have  personally spent 40 minutes total time today in preparation, patient care, and documentation for this visit, including the following: review of clinical lab tests; review of medical tests/procedures/services.   I, Trixie Dredge, am acting as transcriptionist for Dennard Nip, MD.  I have reviewed the above documentation for accuracy and completeness, and I agree with the above. -  Dennard Nip, MD

## 2022-07-01 ENCOUNTER — Ambulatory Visit (INDEPENDENT_AMBULATORY_CARE_PROVIDER_SITE_OTHER): Payer: BLUE CROSS/BLUE SHIELD | Admitting: Family Medicine

## 2022-07-29 ENCOUNTER — Ambulatory Visit (INDEPENDENT_AMBULATORY_CARE_PROVIDER_SITE_OTHER): Payer: BLUE CROSS/BLUE SHIELD | Admitting: Family Medicine

## 2022-08-12 ENCOUNTER — Ambulatory Visit (INDEPENDENT_AMBULATORY_CARE_PROVIDER_SITE_OTHER): Payer: BLUE CROSS/BLUE SHIELD | Admitting: Family Medicine

## 2022-08-31 ENCOUNTER — Encounter (INDEPENDENT_AMBULATORY_CARE_PROVIDER_SITE_OTHER): Payer: Self-pay | Admitting: Family Medicine

## 2022-09-01 ENCOUNTER — Ambulatory Visit (INDEPENDENT_AMBULATORY_CARE_PROVIDER_SITE_OTHER): Payer: BLUE CROSS/BLUE SHIELD | Admitting: Family Medicine

## 2022-09-09 ENCOUNTER — Ambulatory Visit (INDEPENDENT_AMBULATORY_CARE_PROVIDER_SITE_OTHER): Payer: BLUE CROSS/BLUE SHIELD | Admitting: Family Medicine

## 2022-09-29 ENCOUNTER — Ambulatory Visit (INDEPENDENT_AMBULATORY_CARE_PROVIDER_SITE_OTHER): Payer: BC Managed Care – PPO | Admitting: Family Medicine

## 2022-09-29 ENCOUNTER — Encounter (INDEPENDENT_AMBULATORY_CARE_PROVIDER_SITE_OTHER): Payer: Self-pay | Admitting: Family Medicine

## 2022-09-29 VITALS — BP 109/63 | HR 80 | Temp 98.1°F | Ht 65.0 in | Wt 188.0 lb

## 2022-09-29 DIAGNOSIS — R7303 Prediabetes: Secondary | ICD-10-CM | POA: Diagnosis not present

## 2022-09-29 DIAGNOSIS — Z6831 Body mass index (BMI) 31.0-31.9, adult: Secondary | ICD-10-CM | POA: Diagnosis not present

## 2022-09-29 DIAGNOSIS — E669 Obesity, unspecified: Secondary | ICD-10-CM

## 2022-09-29 MED ORDER — METFORMIN HCL 500 MG PO TABS: 500.0000 mg | ORAL_TABLET | Freq: Two times a day (BID) | ORAL | 0 refills | Status: DC

## 2022-09-30 NOTE — Progress Notes (Signed)
Chief Complaint:   OBESITY Ariel Ellis is here to discuss her progress with her obesity treatment plan along with follow-up of her obesity related diagnoses. Ariel Ellis is on keeping a food journal and adhering to recommended goals of 1200-1400 calories and 80+ grams of protein and states she is following her eating plan approximately 25% of the time. Ariel Ellis states she is walking and lifting weights for 60 minutes 6 times per week.  Today's visit was #: 64 Starting weight: 217 lbs Starting date: 02/22/2018 Today's weight: 188 lbs Today's date: 09/29/2022 Total lbs lost to date: 29 Total lbs lost since last in-office visit: 0  Interim History: Patient is last visit was 4 months ago due to insurance issues which is now fixed.  She did a lot of traveling and had increased celebration eating.  She remained mindful of her eating.  She is working on getting back on track.  Subjective:   1. Pre-diabetes Patient has been out of metformin, and she is working on her weight loss again.  She has tried to make healthier food choices and get in some exercise.  Assessment/Plan:   1. Pre-diabetes Patient will continue metformin 500 mg twice daily, and we will refill for 1 month.  We will recheck labs in 1 month.  - metFORMIN (GLUCOPHAGE) 500 MG tablet; Take 1 tablet (500 mg total) by mouth 2 (two) times daily with a meal.  Dispense: 60 tablet; Refill: 0  2. BMI 31.0-31.9,adult  3. Obesity, Beginning BMI 36.11 Ariel Ellis is currently in the action stage of change. As such, her goal is to get back to weightloss efforts . She has agreed to keeping a food journal and adhering to recommended goals of 1200-1400 calories and 80+ grams of protein daily.   Exercise goals: As is.   Behavioral modification strategies: increasing water intake.  Ariel Ellis has agreed to follow-up with our clinic in 4 weeks. She was informed of the importance of frequent follow-up visits to maximize her success with intensive lifestyle modifications  for her multiple health conditions.   Objective:   Blood pressure 109/63, pulse 80, temperature 98.1 F (36.7 C), height 5\' 5"  (1.651 m), weight 188 lb (85.3 kg), last menstrual period 04/05/2012, SpO2 96 %. Body mass index is 31.28 kg/m.  Lab Results  Component Value Date   CREATININE 0.81 03/11/2022   BUN 17 03/11/2022   NA 142 03/11/2022   K 4.3 03/11/2022   CL 105 03/11/2022   CO2 24 03/11/2022   Lab Results  Component Value Date   ALT 16 03/11/2022   AST 18 03/11/2022   ALKPHOS 76 03/11/2022   BILITOT 0.3 03/11/2022   Lab Results  Component Value Date   HGBA1C 5.6 03/11/2022   HGBA1C 5.8 (H) 11/17/2021   HGBA1C 5.8 (H) 06/30/2021   HGBA1C 5.7 (H) 11/25/2020   HGBA1C 5.6 05/19/2020   Lab Results  Component Value Date   INSULIN 4.5 03/11/2022   INSULIN 7.3 11/17/2021   INSULIN 4.9 06/30/2021   INSULIN 7.1 11/25/2020   INSULIN 4.4 05/19/2020   Lab Results  Component Value Date   TSH 2.280 03/11/2022   Lab Results  Component Value Date   CHOL 220 (H) 03/11/2022   HDL 88 03/11/2022   LDLCALC 120 (H) 03/11/2022   TRIG 69 03/11/2022   CHOLHDL 3.5 01/06/2018   Lab Results  Component Value Date   VD25OH 56.6 03/11/2022   VD25OH 44.6 11/17/2021   VD25OH 72.1 06/30/2021   Lab Results  Component Value Date   WBC 4.0 03/11/2022   HGB 13.2 03/11/2022   HCT 40.9 03/11/2022   MCV 89 03/11/2022   PLT 230 03/11/2022   No results found for: "IRON", "TIBC", "FERRITIN"  Attestation Statements:   Reviewed by clinician on day of visit: allergies, medications, problem list, medical history, surgical history, family history, social history, and previous encounter notes.  Time spent on visit including pre-visit chart review and post-visit care and charting was 30 minutes.   I, Burt Knack, am acting as transcriptionist for Quillian Quince, MD.  I have reviewed the above documentation for accuracy and completeness, and I agree with the above. -  Quillian Quince,  MD

## 2022-10-27 ENCOUNTER — Other Ambulatory Visit (INDEPENDENT_AMBULATORY_CARE_PROVIDER_SITE_OTHER): Payer: Self-pay | Admitting: Family Medicine

## 2022-10-27 DIAGNOSIS — R7303 Prediabetes: Secondary | ICD-10-CM

## 2022-10-28 ENCOUNTER — Ambulatory Visit (INDEPENDENT_AMBULATORY_CARE_PROVIDER_SITE_OTHER): Payer: BC Managed Care – PPO | Admitting: Family Medicine

## 2022-10-28 ENCOUNTER — Encounter (INDEPENDENT_AMBULATORY_CARE_PROVIDER_SITE_OTHER): Payer: Self-pay | Admitting: Family Medicine

## 2022-10-28 VITALS — BP 111/70 | HR 68 | Temp 98.2°F | Ht 65.0 in | Wt 188.0 lb

## 2022-10-28 DIAGNOSIS — Z6831 Body mass index (BMI) 31.0-31.9, adult: Secondary | ICD-10-CM

## 2022-10-28 DIAGNOSIS — E669 Obesity, unspecified: Secondary | ICD-10-CM

## 2022-10-28 DIAGNOSIS — E559 Vitamin D deficiency, unspecified: Secondary | ICD-10-CM

## 2022-10-28 DIAGNOSIS — R7303 Prediabetes: Secondary | ICD-10-CM | POA: Diagnosis not present

## 2022-10-28 DIAGNOSIS — E7849 Other hyperlipidemia: Secondary | ICD-10-CM

## 2022-10-28 MED ORDER — METFORMIN HCL 500 MG PO TABS
500.0000 mg | ORAL_TABLET | Freq: Two times a day (BID) | ORAL | 0 refills | Status: DC
Start: 2022-10-28 — End: 2022-11-25

## 2022-10-29 LAB — CMP14+EGFR: BUN/Creatinine Ratio: 26 — ABNORMAL HIGH (ref 9–23)

## 2022-11-01 NOTE — Progress Notes (Signed)
Chief Complaint:   OBESITY Ariel Ellis is here to discuss her progress with her obesity treatment plan along with follow-up of her obesity related diagnoses. Ariel Ellis is on keeping a food journal and adhering to recommended goals of 1200-1400 calories and 80+ grams of protein and states she is following her eating plan approximately 90% of the time. Ariel Ellis states she is walking and weight training for 60 minutes 6 times per week.  Today's visit was #: 65 Starting weight: 217 lbs Starting date: 02/22/2018 Today's weight: 188 lbs Today's date: 10/28/2022 Total lbs lost to date: 29 Total lbs lost since last in-office visit: 0  Interim History: Patient has increased traveling and has a new puppy.  She is working on increasing her protein and not skipping meals.  Subjective:   1. Pre-diabetes Patient is working on her diet and exercise.  She is doing better with remembering to take both doses of her metformin.  2. Other hyperlipidemia Patient is stable with her diet and exercise, and she is due for labs.  She denies chest pain.  3. Vitamin D deficiency Patient has a history of vitamin D deficiency.  She is due to have her labs rechecked.  Assessment/Plan:   1. Pre-diabetes We will check labs today.  Patient will continue metformin twice daily, and we will refill for 1 month.  - metFORMIN (GLUCOPHAGE) 500 MG tablet; Take 1 tablet (500 mg total) by mouth 2 (two) times daily with a meal.  Dispense: 60 tablet; Refill: 0 - Vitamin B12 - CMP14+EGFR - Insulin, random - Hemoglobin A1c  2. Other hyperlipidemia We will check labs today.  Patient will continue with her diet and exercise, and we will follow-up at her next visit.  - Lipid Panel With LDL/HDL Ratio  3. Vitamin D deficiency We will check labs today, and we will follow-up at patient's next visit.  - VITAMIN D 25 Hydroxy (Vit-D Deficiency, Fractures)  4. BMI 31.0-31.9,adult  5. Obesity, Beginning BMI 36.11 Ariel Ellis is currently in the  action stage of change. As such, her goal is to continue with weight loss efforts. She has agreed to keeping a food journal and adhering to recommended goals of 1200-1400 calories and 80+ grams of protein daily.   Exercise goals: As is.   Behavioral modification strategies: increasing lean protein intake and travel eating strategies.  Ariel Ellis has agreed to follow-up with our clinic in 4 weeks. She was informed of the importance of frequent follow-up visits to maximize her success with intensive lifestyle modifications for her multiple health conditions.   Ariel Ellis was informed we would discuss her lab results at her next visit unless there is a critical issue that needs to be addressed sooner. Ariel Ellis agreed to keep her next visit at the agreed upon time to discuss these results.  Objective:   Blood pressure 111/70, pulse 68, temperature 98.2 F (36.8 C), height 5\' 5"  (1.651 m), weight 188 lb (85.3 kg), last menstrual period 04/05/2012, SpO2 98%. Body mass index is 31.28 kg/m.  Lab Results  Component Value Date   CREATININE 0.78 10/28/2022   BUN 20 10/28/2022   NA 144 10/28/2022   K 4.6 10/28/2022   CL 107 (H) 10/28/2022   CO2 22 10/28/2022   Lab Results  Component Value Date   ALT 16 10/28/2022   AST 18 10/28/2022   ALKPHOS 77 10/28/2022   BILITOT 0.3 10/28/2022   Lab Results  Component Value Date   HGBA1C 6.0 (H) 10/28/2022   HGBA1C  5.6 03/11/2022   HGBA1C 5.8 (H) 11/17/2021   HGBA1C 5.8 (H) 06/30/2021   HGBA1C 5.7 (H) 11/25/2020   Lab Results  Component Value Date   INSULIN 5.1 10/28/2022   INSULIN 4.5 03/11/2022   INSULIN 7.3 11/17/2021   INSULIN 4.9 06/30/2021   INSULIN 7.1 11/25/2020   Lab Results  Component Value Date   TSH 2.280 03/11/2022   Lab Results  Component Value Date   CHOL 190 10/28/2022   HDL 77 10/28/2022   LDLCALC 101 (H) 10/28/2022   TRIG 67 10/28/2022   CHOLHDL 3.5 01/06/2018   Lab Results  Component Value Date   VD25OH 52.7 10/28/2022   VD25OH  56.6 03/11/2022   VD25OH 44.6 11/17/2021   Lab Results  Component Value Date   WBC 4.0 03/11/2022   HGB 13.2 03/11/2022   HCT 40.9 03/11/2022   MCV 89 03/11/2022   PLT 230 03/11/2022   No results found for: "IRON", "TIBC", "FERRITIN"  Attestation Statements:   Reviewed by clinician on day of visit: allergies, medications, problem list, medical history, surgical history, family history, social history, and previous encounter notes.   I, Burt Knack, am acting as transcriptionist for Quillian Quince, MD.  I have reviewed the above documentation for accuracy and completeness, and I agree with the above. -  Quillian Quince, MD

## 2022-11-22 ENCOUNTER — Other Ambulatory Visit: Payer: Self-pay | Admitting: Obstetrics and Gynecology

## 2022-11-22 DIAGNOSIS — Z1231 Encounter for screening mammogram for malignant neoplasm of breast: Secondary | ICD-10-CM

## 2022-11-23 ENCOUNTER — Ambulatory Visit
Admission: RE | Admit: 2022-11-23 | Discharge: 2022-11-23 | Disposition: A | Discharge status: Home / Self Care | Payer: BC Managed Care – PPO | Source: Ambulatory Visit | Attending: Obstetrics and Gynecology | Admitting: Obstetrics and Gynecology

## 2022-11-23 DIAGNOSIS — Z1231 Encounter for screening mammogram for malignant neoplasm of breast: Secondary | ICD-10-CM | POA: Diagnosis not present

## 2022-11-25 ENCOUNTER — Ambulatory Visit (INDEPENDENT_AMBULATORY_CARE_PROVIDER_SITE_OTHER): Payer: BC Managed Care – PPO | Admitting: Family Medicine

## 2022-11-25 ENCOUNTER — Encounter (INDEPENDENT_AMBULATORY_CARE_PROVIDER_SITE_OTHER): Payer: Self-pay | Admitting: Family Medicine

## 2022-11-25 VITALS — BP 104/69 | HR 68 | Temp 97.8°F | Ht 65.0 in | Wt 190.0 lb

## 2022-11-25 DIAGNOSIS — Z6831 Body mass index (BMI) 31.0-31.9, adult: Secondary | ICD-10-CM

## 2022-11-25 DIAGNOSIS — R7303 Prediabetes: Secondary | ICD-10-CM

## 2022-11-25 DIAGNOSIS — E669 Obesity, unspecified: Secondary | ICD-10-CM

## 2022-11-25 DIAGNOSIS — F3289 Other specified depressive episodes: Secondary | ICD-10-CM | POA: Diagnosis not present

## 2022-11-25 DIAGNOSIS — F32A Depression, unspecified: Secondary | ICD-10-CM | POA: Insufficient documentation

## 2022-11-25 MED ORDER — METFORMIN HCL 500 MG PO TABS: 500.0000 mg | ORAL_TABLET | Freq: Two times a day (BID) | ORAL | 0 refills | Status: DC

## 2022-11-25 MED ORDER — BUPROPION HCL ER (SR) 150 MG PO TB12: 150.0000 mg | ORAL_TABLET | Freq: Every day | ORAL | 0 refills | Status: DC

## 2022-11-29 NOTE — Progress Notes (Signed)
Chief Complaint:   OBESITY Ariel Ellis is here to discuss her progress with her obesity treatment plan along with follow-up of her obesity related diagnoses. Ariel Ellis is on keeping a food journal and adhering to recommended goals of 1200-1400 calories and 80+ grams of protein and states she is following her eating plan approximately 75% of the time. Ariel Ellis states she is walking and lifting weights for 60-75 minutes 5 times per week.  Today's visit was #: 66 Starting weight: 217 lbs Starting date: 02/22/2018 Today's weight: 190 lbs Today's date: 11/25/2022 Total lbs lost to date: 27 Total lbs lost since last in-office visit: 0  Interim History: Patient has been on vacation and she did some celebration eating.  She has started back to exercising.  Subjective:   1. Pre-diabetes Patient is doing better with taking metformin twice daily.  She continues to work on her diet and weight loss to help prevent diabetes mellitus.  Her recent A1c had increased to 6.0.  2. Emotional Eating Behavior Patient notes struggling with cravings and she is open to medication options.  Assessment/Plan:   1. Pre-diabetes Patient will continue metformin 500 mg twice daily, and we will refill for 1 month.  We will recheck labs in 3 months.  - metFORMIN (GLUCOPHAGE) 500 MG tablet; Take 1 tablet (500 mg total) by mouth 2 (two) times daily with a meal.  Dispense: 60 tablet; Refill: 0  2. Emotional Eating Behavior Patient agreed to start Wellbutrin SR 150 mg every morning with no refills.  We will follow-up at her next visit in 1 month.  - buPROPion (WELLBUTRIN SR) 150 MG 12 hr tablet; Take 1 tablet (150 mg total) by mouth daily.  Dispense: 30 tablet; Refill: 0  3. BMI 31.0-31.9,adult  4. Obesity, Beginning BMI 36.11 Ariel Ellis is currently in the action stage of change. As such, her goal is to continue with weight loss efforts. She has agreed to keeping a food journal and adhering to recommended goals of 1200-1400 calories  and 80+ grams of protein daily.   Exercise goals: As is.   Behavioral modification strategies: meal planning and cooking strategies.  Ariel Ellis has agreed to follow-up with our clinic in 4 weeks. She was informed of the importance of frequent follow-up visits to maximize her success with intensive lifestyle modifications for her multiple health conditions.   Objective:   Blood pressure 104/69, pulse 68, temperature 97.8 F (36.6 C), height 5\' 5"  (1.651 m), weight 190 lb (86.2 kg), last menstrual period 04/05/2012, SpO2 96%. Body mass index is 31.62 kg/m.  Lab Results  Component Value Date   CREATININE 0.78 10/28/2022   BUN 20 10/28/2022   NA 144 10/28/2022   K 4.6 10/28/2022   CL 107 (H) 10/28/2022   CO2 22 10/28/2022   Lab Results  Component Value Date   ALT 16 10/28/2022   AST 18 10/28/2022   ALKPHOS 77 10/28/2022   BILITOT 0.3 10/28/2022   Lab Results  Component Value Date   HGBA1C 6.0 (H) 10/28/2022   HGBA1C 5.6 03/11/2022   HGBA1C 5.8 (H) 11/17/2021   HGBA1C 5.8 (H) 06/30/2021   HGBA1C 5.7 (H) 11/25/2020   Lab Results  Component Value Date   INSULIN 5.1 10/28/2022   INSULIN 4.5 03/11/2022   INSULIN 7.3 11/17/2021   INSULIN 4.9 06/30/2021   INSULIN 7.1 11/25/2020   Lab Results  Component Value Date   TSH 2.280 03/11/2022   Lab Results  Component Value Date   CHOL 190  10/28/2022   HDL 77 10/28/2022   LDLCALC 101 (H) 10/28/2022   TRIG 67 10/28/2022   CHOLHDL 3.5 01/06/2018   Lab Results  Component Value Date   VD25OH 52.7 10/28/2022   VD25OH 56.6 03/11/2022   VD25OH 44.6 11/17/2021   Lab Results  Component Value Date   WBC 4.0 03/11/2022   HGB 13.2 03/11/2022   HCT 40.9 03/11/2022   MCV 89 03/11/2022   PLT 230 03/11/2022   No results found for: "IRON", "TIBC", "FERRITIN"  Attestation Statements:   Reviewed by clinician on day of visit: allergies, medications, problem list, medical history, surgical history, family history, social history, and  previous encounter notes.   I, Burt Knack, am acting as transcriptionist for Quillian Quince, MD.  I have reviewed the above documentation for accuracy and completeness, and I agree with the above. -  Quillian Quince, MD

## 2022-12-18 ENCOUNTER — Other Ambulatory Visit (INDEPENDENT_AMBULATORY_CARE_PROVIDER_SITE_OTHER): Payer: Self-pay | Admitting: Family Medicine

## 2022-12-18 DIAGNOSIS — F3289 Other specified depressive episodes: Secondary | ICD-10-CM

## 2022-12-28 ENCOUNTER — Ambulatory Visit (INDEPENDENT_AMBULATORY_CARE_PROVIDER_SITE_OTHER): Payer: BC Managed Care – PPO | Admitting: Family Medicine

## 2022-12-28 ENCOUNTER — Encounter (INDEPENDENT_AMBULATORY_CARE_PROVIDER_SITE_OTHER): Payer: Self-pay | Admitting: Family Medicine

## 2022-12-28 VITALS — BP 120/83 | HR 69 | Temp 97.9°F | Ht 65.0 in | Wt 187.0 lb

## 2022-12-28 DIAGNOSIS — M79671 Pain in right foot: Secondary | ICD-10-CM

## 2022-12-28 DIAGNOSIS — M79672 Pain in left foot: Secondary | ICD-10-CM | POA: Insufficient documentation

## 2022-12-28 DIAGNOSIS — F5089 Other specified eating disorder: Secondary | ICD-10-CM | POA: Diagnosis not present

## 2022-12-28 DIAGNOSIS — F3289 Other specified depressive episodes: Secondary | ICD-10-CM

## 2022-12-28 DIAGNOSIS — Z6831 Body mass index (BMI) 31.0-31.9, adult: Secondary | ICD-10-CM

## 2022-12-28 DIAGNOSIS — E669 Obesity, unspecified: Secondary | ICD-10-CM

## 2022-12-28 MED ORDER — BUPROPION HCL ER (SR) 150 MG PO TB12
150.0000 mg | ORAL_TABLET | Freq: Every day | ORAL | 0 refills | Status: DC
Start: 2022-12-28 — End: 2023-01-25

## 2022-12-28 NOTE — Progress Notes (Unsigned)
Chief Complaint:   OBESITY Ariel Ellis is here to discuss her progress with her obesity treatment plan along with follow-up of her obesity related diagnoses. Ariel Ellis is on keeping a food journal and adhering to recommended goals of 1200-1400 calories and 80+ grams of protein and states she is following her eating plan approximately 95% of the time. Ariel Ellis states she is walking and weight lifting for 60-90 minutes 6 times per week.  Today's visit was #: 67 Starting weight: 217 lbs Starting date: 02/22/2018 Today's weight: 187 lbs Today's date: 12/28/2022 Total lbs lost to date: 30 Total lbs lost since last in-office visit: 3  Interim History: Patient has been doing especially well with her exercise and she has increased her strengthening.  She is working on Orthoptist and trying new recipes.  Subjective:   1. Foot pain, bilateral Patient notes feet aching after exercise.  She feels this is partially weight related.  2. Emotional Eating Behavior Patient is doing well on bupropion with no side effects noted.  Her blood pressure is stable.  Assessment/Plan:   1. Foot pain, bilateral Patient is to continue to work on her diet, exercise, and weight loss.  If her pain fails to improve we can refer her to Podiatry.  2. Emotional Eating Behavior Patient will continue Wellbutrin SR 150 mg once daily, and we will refill for 1 month.  - buPROPion (WELLBUTRIN SR) 150 MG 12 hr tablet; Take 1 tablet (150 mg total) by mouth daily.  Dispense: 30 tablet; Refill: 0  3. BMI 31.0-31.9,adult  4. Obesity, Beginning BMI 36.11 Ariel Ellis is currently in the action stage of change. As such, her goal is to continue with weight loss efforts. She has agreed to keeping a food journal and adhering to recommended goals of 1200-1400 calories and 80+ grams of protein daily.   Shredded [employer] were given.  Exercise goals: As is.  Behavioral modification strategies: increasing lean protein intake and meal planning  and cooking strategies.  Ariel Ellis has agreed to follow-up with our clinic in 4 weeks. She was informed of the importance of frequent follow-up visits to maximize her success with intensive lifestyle modifications for her multiple health conditions.   Objective:   Blood pressure 120/83, pulse 69, temperature 97.9 F (36.6 C), height 5\' 5"  (1.651 m), weight 187 lb (84.8 kg), last menstrual period 04/05/2012, SpO2 98%. Body mass index is 31.12 kg/m.  Lab Results  Component Value Date   CREATININE 0.78 10/28/2022   BUN 20 10/28/2022   NA 144 10/28/2022   K 4.6 10/28/2022   CL 107 (H) 10/28/2022   CO2 22 10/28/2022   Lab Results  Component Value Date   ALT 16 10/28/2022   AST 18 10/28/2022   ALKPHOS 77 10/28/2022   BILITOT 0.3 10/28/2022   Lab Results  Component Value Date   HGBA1C 6.0 (H) 10/28/2022   HGBA1C 5.6 03/11/2022   HGBA1C 5.8 (H) 11/17/2021   HGBA1C 5.8 (H) 06/30/2021   HGBA1C 5.7 (H) 11/25/2020   Lab Results  Component Value Date   INSULIN 5.1 10/28/2022   INSULIN 4.5 03/11/2022   INSULIN 7.3 11/17/2021   INSULIN 4.9 06/30/2021   INSULIN 7.1 11/25/2020   Lab Results  Component Value Date   TSH 2.280 03/11/2022   Lab Results  Component Value Date   CHOL 190 10/28/2022   HDL 77 10/28/2022   LDLCALC 101 (H) 10/28/2022   TRIG 67 10/28/2022   CHOLHDL 3.5 01/06/2018   Lab Results  Component Value Date   VD25OH 52.7 10/28/2022   VD25OH 56.6 03/11/2022   VD25OH 44.6 11/17/2021   Lab Results  Component Value Date   WBC 4.0 03/11/2022   HGB 13.2 03/11/2022   HCT 40.9 03/11/2022   MCV 89 03/11/2022   PLT 230 03/11/2022   No results found for: "IRON", "TIBC", "FERRITIN"  Attestation Statements:   Reviewed by clinician on day of visit: allergies, medications, problem list, medical history, surgical history, family history, social history, and previous encounter notes.   I, Burt Knack, am acting as transcriptionist for Quillian Quince, MD.  I have  reviewed the above documentation for accuracy and completeness, and I agree with the above. -  Quillian Quince, MD

## 2023-01-25 ENCOUNTER — Other Ambulatory Visit (INDEPENDENT_AMBULATORY_CARE_PROVIDER_SITE_OTHER): Payer: Self-pay | Admitting: Family Medicine

## 2023-01-25 ENCOUNTER — Telehealth (INDEPENDENT_AMBULATORY_CARE_PROVIDER_SITE_OTHER): Payer: Self-pay | Admitting: Family Medicine

## 2023-01-25 DIAGNOSIS — F3289 Other specified depressive episodes: Secondary | ICD-10-CM

## 2023-01-25 MED ORDER — BUPROPION HCL ER (SR) 150 MG PO TB12: 150.0000 mg | ORAL_TABLET | Freq: Every day | ORAL | 0 refills | Status: DC

## 2023-01-25 NOTE — Telephone Encounter (Signed)
Called pt on 10/22 to reschedule her 10/24 appointment with Dr. Dalbert Garnet. Patient rescheduled to 02/10/23. Patient stated she will need a refill on her Wellbutrin before that appointment. Patient asks for a refill [facility] [address].  Thank you

## 2023-01-25 NOTE — Telephone Encounter (Signed)
[place] to rf x 1

## 2023-01-27 ENCOUNTER — Ambulatory Visit (INDEPENDENT_AMBULATORY_CARE_PROVIDER_SITE_OTHER): Payer: BC Managed Care – PPO | Admitting: Family Medicine

## 2023-02-10 ENCOUNTER — Ambulatory Visit (INDEPENDENT_AMBULATORY_CARE_PROVIDER_SITE_OTHER): Payer: BC Managed Care – PPO | Admitting: Family Medicine

## 2023-02-10 ENCOUNTER — Encounter (INDEPENDENT_AMBULATORY_CARE_PROVIDER_SITE_OTHER): Payer: Self-pay | Admitting: Family Medicine

## 2023-02-10 VITALS — BP 119/65 | HR 73 | Temp 98.0°F | Ht 65.0 in | Wt 190.0 lb

## 2023-02-10 DIAGNOSIS — E669 Obesity, unspecified: Secondary | ICD-10-CM

## 2023-02-10 DIAGNOSIS — E785 Hyperlipidemia, unspecified: Secondary | ICD-10-CM | POA: Diagnosis not present

## 2023-02-10 DIAGNOSIS — E559 Vitamin D deficiency, unspecified: Secondary | ICD-10-CM | POA: Diagnosis not present

## 2023-02-10 DIAGNOSIS — R7303 Prediabetes: Secondary | ICD-10-CM

## 2023-02-10 DIAGNOSIS — E7849 Other hyperlipidemia: Secondary | ICD-10-CM

## 2023-02-10 DIAGNOSIS — Z6831 Body mass index (BMI) 31.0-31.9, adult: Secondary | ICD-10-CM

## 2023-02-10 DIAGNOSIS — F3289 Other specified depressive episodes: Secondary | ICD-10-CM

## 2023-02-10 MED ORDER — METFORMIN HCL 500 MG PO TABS: 500.0000 mg | ORAL_TABLET | Freq: Two times a day (BID) | ORAL | 0 refills | Status: DC

## 2023-02-10 MED ORDER — BUPROPION HCL ER (SR) 150 MG PO TB12: 150.0000 mg | ORAL_TABLET | Freq: Every day | ORAL | 0 refills | Status: DC

## 2023-02-10 NOTE — Progress Notes (Signed)
.[facility]  Office: [phone]  /  Fax: [phone]  WEIGHT SUMMARY AND BIOMETRICS  Anthropometric Measurements Height: 5\' 5"  (1.651 m) Weight: 190 lb (86.2 kg) BMI (Calculated): 31.62 Weight at Last Visit: 187 lb Weight Lost Since Last Visit: 0 Weight Gained Since Last Visit: 3 lb Starting Weight: 217 lb Total Weight Loss (lbs): 27 lb (12.2 kg)   Body Composition  Body Fat %: 41 % Fat Mass (lbs): 78.2 lbs Muscle Mass (lbs): 106.8 lbs Total Body Water (lbs): 81.8 lbs Visceral Fat Rating : 10   Other Clinical Data Fasting: No Labs: No Today's Visit #: 40 Starting Date: 02/22/18    Chief Complaint: OBESITY   History of Present Illness   The patient, diagnosed with prediabetes, obesity, and depression, presents with concerns related to weight gain and emotional eating behaviors. Over the past six weeks, she has gained three pounds despite efforts to manage her caloric intake and maintain a regular exercise routine. She reports adherence to a diet plan, counting calories approximately 75% of the time, with a daily goal of 12-14 hundred calories and 80 grams of protein. She engages in various exercises five times a week for about 60 minutes each session.  The patient is currently on Wellbutrin for emotional eating behaviors and Metformin for prediabetes. She reports no issues with these medications, although she admits to occasionally forgetting the second dose of Metformin.  The patient has experienced a stressful month, which has disrupted her meal planning and preparation. She reports eating out more frequently due to work commitments, which may have increased her sodium intake. Despite these challenges, she has managed to maintain her exercise routine.  The patient acknowledges the need to pay more attention to her satiety cues, expressing a desire to avoid overeating and unnecessary snacking. She is aware of the need to reassess her hunger levels before taking additional  servings of food.  The patient is committed to returning to her regular diet and exercise routine, expressing optimism about managing her health conditions more effectively in the future.          PHYSICAL EXAM:  Blood pressure 119/65, pulse 73, temperature 98 F (36.7 C), height 5\' 5"  (1.651 m), weight 190 lb (86.2 kg), last menstrual period 04/05/2012, SpO2 97%. Body mass index is 31.62 kg/m.  DIAGNOSTIC DATA REVIEWED:  BMET    Component Value Date/Time   NA 144 10/28/2022 1009   K 4.6 10/28/2022 1009   CL 107 (H) 10/28/2022 1009   CO2 22 10/28/2022 1009   GLUCOSE 89 10/28/2022 1009   GLUCOSE 81 05/02/2015 1405   BUN 20 10/28/2022 1009   CREATININE 0.78 10/28/2022 1009   CREATININE 0.70 05/02/2015 1405   CALCIUM 8.8 10/28/2022 1009   GFRNONAA 78 05/19/2020 0849   GFRAA 90 05/19/2020 0849   Lab Results  Component Value Date   HGBA1C 6.0 (H) 10/28/2022   HGBA1C 5.8 (H) 02/22/2018   Lab Results  Component Value Date   INSULIN 5.1 10/28/2022   INSULIN 13.1 02/22/2018   Lab Results  Component Value Date   TSH 2.280 03/11/2022   CBC    Component Value Date/Time   WBC 4.0 03/11/2022 0824   WBC 5.7 05/02/2015 1405   RBC 4.62 03/11/2022 0824   RBC 4.64 05/02/2015 1405   HGB 13.2 03/11/2022 0824   HGB 13.1 05/17/2013 1518   HCT 40.9 03/11/2022 0824   PLT 230 03/11/2022 0824   MCV 89 03/11/2022 0824   MCH 28.6 03/11/2022 0824  MCH 28.4 05/02/2015 1405   MCHC 32.3 03/11/2022 0824   MCHC 32.8 05/02/2015 1405   RDW 13.2 03/11/2022 0824   Iron Studies No results found for: "IRON", "TIBC", "FERRITIN", "IRONPCTSAT" Lipid Panel     Component Value Date/Time   CHOL 190 10/28/2022 1009   TRIG 67 10/28/2022 1009   HDL 77 10/28/2022 1009   CHOLHDL 3.5 01/06/2018 1130   CHOLHDL 2.9 05/02/2015 1405   VLDL 20 05/02/2015 1405   LDLCALC 101 (H) 10/28/2022 1009   Hepatic Function Panel     Component Value Date/Time   PROT 6.0 10/28/2022 1009   ALBUMIN 4.1  10/28/2022 1009   AST 18 10/28/2022 1009   ALT 16 10/28/2022 1009   ALKPHOS 77 10/28/2022 1009   BILITOT 0.3 10/28/2022 1009      Component Value Date/Time   TSH 2.280 03/11/2022 0824   Nutritional Lab Results  Component Value Date   VD25OH 52.7 10/28/2022   VD25OH 56.6 03/11/2022   VD25OH 44.6 11/17/2021     Assessment and Plan    Prediabetes Stable on Metformin. Recent stressors and events have led to inconsistent meal planning and potential suboptimal hydration. -Continue Metformin. -Encourage consistent meal planning and adequate hydration. -Check fasting labs, including A1C.  Obesity Recent weight gain of 3 pounds. Patient is exercising regularly and counting calories most of the time. Discussed strategies for portion control and mindful eating, especially during holiday season. -Continue current exercise regimen. -Encourage use of portion control strategies and mindful eating. -Plan for follow-up visit before Christmas.  Depression with emotional eating Stable on Wellbutrin. Recent stressors have led to some emotional eating. -Continue Wellbutrin. -Encourage use of coping strategies for stress and emotional eating. -Refill Wellbutrin prescription.   Vit D deficiency no symptoms mentioned -check labs and follow   HLD patient working on diet and weight loss -check labs -continue low cholesterol diet and weight loss efforts.          She was informed of the importance of frequent follow up visits to maximize her success with intensive lifestyle modifications for her multiple health conditions.    Quillian Quince, MD

## 2023-03-15 ENCOUNTER — Encounter (INDEPENDENT_AMBULATORY_CARE_PROVIDER_SITE_OTHER): Payer: Self-pay | Admitting: Physician Assistant

## 2023-03-15 ENCOUNTER — Ambulatory Visit (INDEPENDENT_AMBULATORY_CARE_PROVIDER_SITE_OTHER): Payer: BC Managed Care – PPO | Admitting: Physician Assistant

## 2023-03-15 VITALS — BP 112/74 | HR 69 | Temp 97.9°F | Ht 65.0 in | Wt 190.0 lb

## 2023-03-15 DIAGNOSIS — E669 Obesity, unspecified: Secondary | ICD-10-CM

## 2023-03-15 DIAGNOSIS — E559 Vitamin D deficiency, unspecified: Secondary | ICD-10-CM | POA: Diagnosis not present

## 2023-03-15 DIAGNOSIS — F3289 Other specified depressive episodes: Secondary | ICD-10-CM | POA: Diagnosis not present

## 2023-03-15 DIAGNOSIS — E7849 Other hyperlipidemia: Secondary | ICD-10-CM | POA: Diagnosis not present

## 2023-03-15 DIAGNOSIS — R7303 Prediabetes: Secondary | ICD-10-CM

## 2023-03-15 DIAGNOSIS — Z6831 Body mass index (BMI) 31.0-31.9, adult: Secondary | ICD-10-CM

## 2023-03-15 MED ORDER — METFORMIN HCL 500 MG PO TABS: 500.0000 mg | ORAL_TABLET | Freq: Two times a day (BID) | ORAL | 0 refills | Status: DC

## 2023-03-15 MED ORDER — BUPROPION HCL ER (SR) 150 MG PO TB12: 150.0000 mg | ORAL_TABLET | Freq: Every day | ORAL | 0 refills | Status: DC

## 2023-03-15 NOTE — Progress Notes (Unsigned)
SUBJECTIVE:  Chief Complaint: Obesity  Interim History: She has maintained since last visit.  Down 27 lbs overall TBW loss of 12.4% Ariel Ellis, a 57 year old individual with a history of prediabetes and emotional eating behavior, presents for a follow-up visit regarding her obesity treatment plan. She is currently on metformin 500mg  twice daily and Wellbutrin SR 150mg  daily. The patient reports a struggle with maintaining a consistent routine for fasting blood work, attributing this to a busy schedule and external commitments. Despite these challenges, she has managed to maintain her weight over the holiday period, with an overall weight loss of 27 pounds to date.  The patient's goal is to continue losing weight and return to her previous weight status. She expresses a desire to resume daily journaling and regular exercise, both of which have been disrupted due to the holiday season and other commitments. She reports a previous routine of walking on the treadmill five days a week and strength training three days a week, but this has been inconsistent recently due to external factors and responsibilities.  The patient also reports recent difficulties with sleep, which she attributes to sciatic pain and increased stress levels. She expresses an interest in incorporating yoga into her routine to help with relaxation and sleep. Despite these challenges, the patient remains committed to her weight loss journey and is actively seeking strategies to improve her health behaviors, including dietary changes and increased physical activity.  The patient is currently taking metformin and Wellbutrin SR with no reported issues. She expresses a preference for home-cooked meals over processed foods and has developed strategies for managing her diet, such as preparing large batches of healthy meals for future consumption. Despite the challenges faced, the patient remains committed to her weight loss journey and is  actively seeking strategies to improve her health behaviors.  [place] is here to discuss her progress with her obesity treatment plan. She is on the keeping a food journal and adhering to recommended goals of 1200-1400 calories and 80+ grams of protein and states she is following her eating plan approximately 50 % of the time. She states she is exercising walking/weights 45/35 minutes 5/2-3 times per week.   OBJECTIVE: Visit Diagnoses: Problem List Items Addressed This Visit     Prediabetes - Primary   Vitamin D deficiency   Other hyperlipidemia   Obesity, Beginning BMI 36.11   Depression  Obesity 57 year old with obesity, prediabetes, and emotional eating. Lost 27 pounds but aims to lose more. Challenges include maintaining exercise and managing stress, especially during holidays. Advised on hunger management and healthier food choices, emphasizing protein intake before meals. Stressed consistency in exercise and dietary journaling. Discussed sleep's impact on weight management and strategies to improve sleep quality, including yoga. - Continue metformin 500 mg twice daily - Continue Wellbutrin SR 150 mg daily - Encourage dietary journaling 3-4 times a week - Recommend protein intake before meals - Advise resuming strength training and treadmill walking - Recommend yoga for sleep improvement  Emotional Eating Emotional eating managed with Wellbutrin SR. Discussed potential dose increase to curb cravings if needed. - Continue Wellbutrin SR 150 mg daily - Consider increasing Wellbutrin dose if needed  Prediabetes Prediabetes managed with metformin. Emphasized diet, exercise, and medication adherence for blood glucose control. Need for fasting blood work to monitor glucose levels. - Continue metformin 500 mg twice daily - Order fasting blood work at [employer] [facility] Discussed balanced diet, regular exercise, and adequate sleep. Provided stress management and  sleep  improvement strategies. Emphasized sleep's role in weight management and overall health. - Encourage balanced diet with minimal processed foods - Recommend regular exercise, including strength training and walking - Advise on sleep hygiene and importance of 7-8 hours of sleep - Recommend yoga for relaxation and sleep improvement  Follow-up - Order fasting blood work at [facility] and orders entered at last visit per Dr. Dalbert Garnet and patient plans to go into local [facility] to have fasting labs done before the next visit.   - [facility] [place] - Schedule follow-up appointment for weight management support.  Vitals Temp: 97.9 F (36.6 C) BP: 112/74 Pulse Rate: 69 SpO2: 97 %   Anthropometric Measurements Height: 5\' 5"  (1.651 m) Weight: 190 lb (86.2 kg) BMI (Calculated): 31.62 Weight at Last Visit: 190lb Weight Lost Since Last Visit: 0 Weight Gained Since Last Visit: 0 Starting Weight: 217lb Total Weight Loss (lbs): 27 lb (12.2 kg)   Body Composition  Body Fat %: 43 % Fat Mass (lbs): 82 lbs Muscle Mass (lbs): 103.2 lbs Total Body Water (lbs): 83.4 lbs Visceral Fat Rating : 11   Other Clinical Data Fasting: no Labs: no Today's Visit #: 60 Starting Date: 02/22/18     ASSESSMENT AND PLAN:  Diet: [place] is currently in the action stage of change. As such, her goal is to continue with weight loss efforts. She has agreed to keeping a food journal and adhering to recommended goals of 1200-1400 calories and 80+ grams of protein.  Exercise: [place] has been instructed to work up to a goal of 150 minutes of combined cardio and strengthening exercise per week for weight loss and overall health benefits.   Behavior Modification:  We discussed the following Behavioral Modification Strategies today: increasing lean protein intake, decreasing simple carbohydrates, increasing vegetables, increase H2O intake, increase high fiber foods, holiday  eating strategies, and planning for success. We discussed various medication options to help Emya with her weight loss efforts and we both agreed to continue metformin for primary indication of prediabetes.  Return in about 4 weeks (around 04/12/2023).Marland Kitchen She was informed of the importance of frequent follow up visits to maximize her success with intensive lifestyle modifications for her multiple health conditions.  Attestation Statements:   Reviewed by clinician on day of visit: allergies, medications, problem list, medical history, surgical history, family history, social history, and previous encounter notes.   Time spent on visit including pre-visit chart review and post-visit care and charting was *** minutes.    Raheen Capili, PA-C

## 2023-04-10 ENCOUNTER — Other Ambulatory Visit (INDEPENDENT_AMBULATORY_CARE_PROVIDER_SITE_OTHER): Payer: Self-pay | Admitting: Physician Assistant

## 2023-04-10 DIAGNOSIS — R7303 Prediabetes: Secondary | ICD-10-CM

## 2023-04-14 ENCOUNTER — Other Ambulatory Visit (INDEPENDENT_AMBULATORY_CARE_PROVIDER_SITE_OTHER): Payer: Self-pay | Admitting: Physician Assistant

## 2023-04-14 DIAGNOSIS — F3289 Other specified depressive episodes: Secondary | ICD-10-CM

## 2023-04-23 LAB — CMP14+EGFR
ALT: 25 [IU]/L (ref 0–32)
AST: 17 [IU]/L (ref 0–40)
Albumin: 4.3 g/dL (ref 3.8–4.9)
Alkaline Phosphatase: 90 [IU]/L (ref 44–121)
BUN/Creatinine Ratio: 21 (ref 9–23)
BUN: 21 mg/dL (ref 6–24)
Bilirubin Total: 0.3 mg/dL (ref 0.0–1.2)
CO2: 21 mmol/L (ref 20–29)
Calcium: 9 mg/dL (ref 8.7–10.2)
Chloride: 107 mmol/L — ABNORMAL HIGH (ref 96–106)
Creatinine, Ser: 0.98 mg/dL (ref 0.57–1.00)
Globulin, Total: 1.8 g/dL (ref 1.5–4.5)
Glucose: 107 mg/dL — ABNORMAL HIGH (ref 70–99)
Potassium: 4.7 mmol/L (ref 3.5–5.2)
Sodium: 144 mmol/L (ref 134–144)
Total Protein: 6.1 g/dL (ref 6.0–8.5)
eGFR: 67 mL/min/{1.73_m2} (ref >59)

## 2023-04-23 LAB — LIPID PANEL WITH LDL/HDL RATIO
Cholesterol, Total: 216 mg/dL — ABNORMAL HIGH (ref 100–199)
HDL: 73 mg/dL (ref >39)
LDL Chol Calc (NIH): 128 mg/dL — ABNORMAL HIGH (ref 0–99)
LDL/HDL Ratio: 1.8 {ratio} (ref 0.0–3.2)
Triglycerides: 84 mg/dL (ref 0–149)
VLDL Cholesterol Cal: 15 mg/dL (ref 5–40)

## 2023-04-23 LAB — INSULIN, RANDOM: INSULIN: 10.3 u[IU]/mL (ref 2.6–24.9)

## 2023-04-23 LAB — HEMOGLOBIN A1C
Est. average glucose Bld gHb Est-mCnc: 123 mg/dL
Hgb A1c MFr Bld: 5.9 % — ABNORMAL HIGH (ref 4.8–5.6)

## 2023-04-23 LAB — VITAMIN D 25 HYDROXY (VIT D DEFICIENCY, FRACTURES): Vit D, 25-Hydroxy: 56 ng/mL (ref 30.0–100.0)

## 2023-04-23 LAB — VITAMIN B12: Vitamin B-12: 637 pg/mL (ref 232–1245)

## 2023-04-27 ENCOUNTER — Encounter (INDEPENDENT_AMBULATORY_CARE_PROVIDER_SITE_OTHER): Payer: Self-pay | Admitting: Family Medicine

## 2023-04-27 ENCOUNTER — Ambulatory Visit (INDEPENDENT_AMBULATORY_CARE_PROVIDER_SITE_OTHER): Payer: Managed Care, Other (non HMO) | Admitting: Family Medicine

## 2023-04-27 VITALS — BP 137/78 | HR 77 | Temp 98.0°F | Ht 65.0 in | Wt 195.0 lb

## 2023-04-27 DIAGNOSIS — R7303 Prediabetes: Secondary | ICD-10-CM

## 2023-04-27 DIAGNOSIS — Z6832 Body mass index (BMI) 32.0-32.9, adult: Secondary | ICD-10-CM | POA: Diagnosis not present

## 2023-04-27 DIAGNOSIS — F5089 Other specified eating disorder: Secondary | ICD-10-CM

## 2023-04-27 DIAGNOSIS — K0889 Other specified disorders of teeth and supporting structures: Secondary | ICD-10-CM

## 2023-04-27 DIAGNOSIS — E669 Obesity, unspecified: Secondary | ICD-10-CM

## 2023-04-27 DIAGNOSIS — F3289 Other specified depressive episodes: Secondary | ICD-10-CM

## 2023-04-27 MED ORDER — METFORMIN HCL 500 MG PO TABS: 500.0000 mg | ORAL_TABLET | Freq: Two times a day (BID) | ORAL | 0 refills | Status: DC

## 2023-04-27 MED ORDER — BUPROPION HCL ER (SR) 200 MG PO TB12: 200.0000 mg | ORAL_TABLET | Freq: Every day | ORAL | 0 refills | Status: DC

## 2023-04-27 NOTE — Progress Notes (Signed)
.[facility]  Office: [phone]  /  Fax: [phone]  WEIGHT SUMMARY AND BIOMETRICS  Anthropometric Measurements Height: 5\' 5"  (1.651 m) Weight: 195 lb (88.5 kg) BMI (Calculated): 32.45 Weight at Last Visit: 190 lb Weight Lost Since Last Visit: 5 lb Weight Gained Since Last Visit: 0 Starting Weight: 217 lb Total Weight Loss (lbs): 22 lb (9.979 kg)   Body Composition  Body Fat %: 42.5 % Fat Mass (lbs): 82.8 lbs Muscle Mass (lbs): 106.6 lbs Total Body Water (lbs): 81.6 lbs Visceral Fat Rating : 11   Other Clinical Data Fasting: No Labs: No Today's Visit #: 70 Starting Date: 02/22/18    Chief Complaint: OBESITY   History of Present Illness   The patient, with a history of prediabetes, emotional eating behaviors, and obesity, presents for a follow-up visit after a six-week interval that included the holiday season. During this period, she gained five pounds. She has been attempting to manage her weight through journaling, setting a calorie goal of 1200-1400 and a protein goal of 80 or more grams per day, which she reports adhering to about 80% of the time. She also engages in 60 minutes of exercise five times per week.  The patient is currently on metformin for prediabetes management and has been making efforts to decrease her intake of simple carbohydrates. She is also on Wellbutrin for emotional eating behaviors. She reports no issues with these medications.  Recently, the patient has been experiencing pain in a tooth, which has been diagnosed as requiring a root canal. This dental issue has affected her eating habits, particularly her intake of fruits, vegetables, and salads. She also reports some stress during the holiday season, which may have contributed to her weight gain.  Despite these challenges, the patient has been able to maintain her exercise routine and is making efforts to increase her weight lifting. She expresses a willingness to consider adjustments to her  medication regimen if necessary to manage her hunger and cravings.          PHYSICAL EXAM:  Blood pressure 137/78, pulse 77, temperature 98 F (36.7 C), height 5\' 5"  (1.651 m), weight 195 lb (88.5 kg), last menstrual period 04/05/2012, SpO2 97%. Body mass index is 32.45 kg/m.  DIAGNOSTIC DATA REVIEWED:  BMET    Component Value Date/Time   NA 144 04/22/2023 0932   K 4.7 04/22/2023 0932   CL 107 (H) 04/22/2023 0932   CO2 21 04/22/2023 0932   GLUCOSE 107 (H) 04/22/2023 0932   GLUCOSE 81 05/02/2015 1405   BUN 21 04/22/2023 0932   CREATININE 0.98 04/22/2023 0932   CREATININE 0.70 05/02/2015 1405   CALCIUM 9.0 04/22/2023 0932   GFRNONAA 78 05/19/2020 0849   GFRAA 90 05/19/2020 0849   Lab Results  Component Value Date   HGBA1C 5.9 (H) 04/22/2023   HGBA1C 5.8 (H) 02/22/2018   Lab Results  Component Value Date   INSULIN 10.3 04/22/2023   INSULIN 13.1 02/22/2018   Lab Results  Component Value Date   TSH 2.280 03/11/2022   CBC    Component Value Date/Time   WBC 4.0 03/11/2022 0824   WBC 5.7 05/02/2015 1405   RBC 4.62 03/11/2022 0824   RBC 4.64 05/02/2015 1405   HGB 13.2 03/11/2022 0824   HGB 13.1 05/17/2013 1518   HCT 40.9 03/11/2022 0824   PLT 230 03/11/2022 0824   MCV 89 03/11/2022 0824   MCH 28.6 03/11/2022 0824   MCH 28.4 05/02/2015 1405   MCHC 32.3 03/11/2022 0824  MCHC 32.8 05/02/2015 1405   RDW 13.2 03/11/2022 0824   Iron Studies No results found for: "IRON", "TIBC", "FERRITIN", "IRONPCTSAT" Lipid Panel     Component Value Date/Time   CHOL 216 (H) 04/22/2023 0932   TRIG 84 04/22/2023 0932   HDL 73 04/22/2023 0932   CHOLHDL 3.5 01/06/2018 1130   CHOLHDL 2.9 05/02/2015 1405   VLDL 20 05/02/2015 1405   LDLCALC 128 (H) 04/22/2023 0932   Hepatic Function Panel     Component Value Date/Time   PROT 6.1 04/22/2023 0932   ALBUMIN 4.3 04/22/2023 0932   AST 17 04/22/2023 0932   ALT 25 04/22/2023 0932   ALKPHOS 90 04/22/2023 0932   BILITOT 0.3  04/22/2023 0932      Component Value Date/Time   TSH 2.280 03/11/2022 0824   Nutritional Lab Results  Component Value Date   VD25OH 56.0 04/22/2023   VD25OH 52.7 10/28/2022   VD25OH 56.6 03/11/2022     Assessment and Plan    Tooth Pain   Reports pain in a tooth, scheduled for a root canal today. This has affected her ability to eat solid foods, particularly protein. Discussed benefits of liquid protein until able to eat solid foods comfortably.   - Proceed with root canal   - Consider liquid protein until able to eat solid foods comfortably    Obesity   Gained five pounds over the holiday period, with only one pound being fat and the rest likely fluid. Following a calorie goal of 1200-1400 and a protein goal of 80+ grams about 80% of the time. Exercising 60 minutes five times per week. Discussed importance of resuming regimen post-holidays and setting a goal weight of 180 lbs for the next year.   - Continue current diet and exercise regimen   - Set goal weight of 180 lbs for the next year    Emotional Eating   On Wellbutrin for emotional eating behaviors and requests a refill. Increased stress and pain from tooth issue may have affected eating habits. Discussed increasing Wellbutrin dosage to help manage stress and emotional eating. Explained significant difference between 150 mg and 200 mg, noting it takes a couple of weeks to take effect.   - Increase Wellbutrin SR to 200 mg in the morning   - Refill Wellbutrin    Prediabetes   On metformin and reducing simple carbohydrates. Recent blood work showed fasting glucose slightly elevated but A1c improved. Insulin levels appropriate for glucose levels. Discussed importance of monitoring blood glucose and A1c levels to prevent progression to diabetes.   - Refill metformin   - Monitor blood glucose and A1c levels    General Health Maintenance   Recent blood work showed fluctuations in electrolytes and hydration status. Cholesterol  levels increased slightly, likely due to holiday diet. Vitamin D and B12 levels within normal range. Discussed importance of increasing water intake and monitoring cholesterol levels.   - Increase water intake   - Monitor cholesterol levels   - Continue current vitamin D and B12 intake    Follow-up   - Schedule next appointment in four weeks   - Consider scheduling additional appointments in advance.        She was informed of the importance of frequent follow up visits to maximize her success with intensive lifestyle modifications for her multiple health conditions.    Quillian Quince, MD

## 2023-05-19 ENCOUNTER — Other Ambulatory Visit (INDEPENDENT_AMBULATORY_CARE_PROVIDER_SITE_OTHER): Payer: Self-pay | Admitting: Family Medicine

## 2023-05-19 DIAGNOSIS — R7303 Prediabetes: Secondary | ICD-10-CM

## 2023-05-19 DIAGNOSIS — F3289 Other specified depressive episodes: Secondary | ICD-10-CM

## 2023-05-25 ENCOUNTER — Ambulatory Visit (INDEPENDENT_AMBULATORY_CARE_PROVIDER_SITE_OTHER): Payer: BC Managed Care – PPO | Admitting: Family Medicine

## 2023-05-26 ENCOUNTER — Other Ambulatory Visit (INDEPENDENT_AMBULATORY_CARE_PROVIDER_SITE_OTHER): Payer: Self-pay | Admitting: Adult Health

## 2023-05-26 ENCOUNTER — Encounter (INDEPENDENT_AMBULATORY_CARE_PROVIDER_SITE_OTHER): Payer: Self-pay | Admitting: Adult Health

## 2023-05-26 ENCOUNTER — Ambulatory Visit (INDEPENDENT_AMBULATORY_CARE_PROVIDER_SITE_OTHER): Payer: Managed Care, Other (non HMO) | Admitting: Adult Health

## 2023-05-26 VITALS — BP 109/74 | HR 77 | Temp 98.1°F | Ht 65.0 in | Wt 194.0 lb

## 2023-05-26 DIAGNOSIS — Z6832 Body mass index (BMI) 32.0-32.9, adult: Secondary | ICD-10-CM

## 2023-05-26 DIAGNOSIS — K0889 Other specified disorders of teeth and supporting structures: Secondary | ICD-10-CM | POA: Diagnosis not present

## 2023-05-26 DIAGNOSIS — E7800 Pure hypercholesterolemia, unspecified: Secondary | ICD-10-CM | POA: Diagnosis not present

## 2023-05-26 DIAGNOSIS — F3289 Other specified depressive episodes: Secondary | ICD-10-CM

## 2023-05-26 DIAGNOSIS — E669 Obesity, unspecified: Secondary | ICD-10-CM

## 2023-05-26 DIAGNOSIS — F5089 Other specified eating disorder: Secondary | ICD-10-CM

## 2023-05-26 DIAGNOSIS — R7303 Prediabetes: Secondary | ICD-10-CM | POA: Diagnosis not present

## 2023-05-26 DIAGNOSIS — E559 Vitamin D deficiency, unspecified: Secondary | ICD-10-CM | POA: Diagnosis not present

## 2023-05-26 MED ORDER — METFORMIN HCL 500 MG PO TABS: 500.0000 mg | ORAL_TABLET | Freq: Two times a day (BID) | ORAL | 0 refills | Status: DC

## 2023-05-26 MED ORDER — BUPROPION HCL ER (SR) 200 MG PO TB12: 200.0000 mg | ORAL_TABLET | Freq: Every day | ORAL | 0 refills | Status: DC

## 2023-05-26 NOTE — Progress Notes (Signed)
 WEIGHT SUMMARY AND BIOMETRICS  Vitals Temp: 98.1 F (36.7 C) BP: 109/74 Pulse Rate: 77 SpO2: 97 %   Anthropometric Measurements Height: 5\' 5"  (1.651 m) Weight: 194 lb (88 kg) BMI (Calculated): 32.28 Weight at Last Visit: 195 lb Weight Lost Since Last Visit: 1 lb Weight Gained Since Last Visit: 0 lb Starting Weight: 217 lb Total Weight Loss (lbs): 23 lb (10.4 kg)   Body Composition  Body Fat %: 41.2 % Fat Mass (lbs): 80 lbs Muscle Mass (lbs): 108.2 lbs Total Body Water (lbs): 82 lbs Visceral Fat Rating : 11   Other Clinical Data Fasting: No Labs: No Today's Visit #: 53 Starting Date: 02/22/18    Chief Complaint:   OBESITY Ariel Ellis is here to discuss her progress with her obesity treatment plan.  She is on the keeping a food journal and adhering to recommended goals of 1200-1400 calories and 80 protein and states she is following her eating plan approximately 90 % of the time. She states she is exercising Walking/Strength Training 50/35 minutes 5/3 times per week.   Interim History:  Ariel Ellis lives with her husband and two adult daughters, ages 61 and 19. Ariel Ellis reports meal planning/prepping and cooking meals for the family.  She will be challenged to continue regular exercise when she needs to travel for work. She works FT at [employer].  Hydration-she is unsure of total oz water per day.  She will make a 2L pitcher of crystal light and usually consumes in 1-2 days. She also enjoys hot tea.  Subjective:   1. Tooth pain She had successful root canal at end of Jan 2025. She has completed ABX course and denies pain at present  2. Pre-diabetes Discussed Labs 04/22/2023 CMP: Electrolytes, kidney/liver enzymes are stable 04/22/2023 CMP: GFR> 60   Latest Reference Range & Units 04/22/23 09:32  Glucose 70 - 99 mg/dL 409 (H)  Hemoglobin W1X 4.8 - 5.6 % 5.9 (H)  Est. average glucose Bld gHb Est-mCnc mg/dL 914  INSULIN 2.6 - 78.2 uIU/mL 10.3   (H): Data is abnormally high  CBG and Insulin levels both worsened. A1c slightly decreased, however still in prediabetic range. She is on Metformin 500mg  BID- denies GI upset. She is able to take morning dose consistently, evening dose is more difficult to consistently take.  3. Vitamin D deficiency Discussed Labs  Latest Reference Range & Units 04/22/23 09:32  Vitamin D, 25-Hydroxy 30.0 - 100.0 ng/mL 56.0  Vitamin B12 232 - 1,245 pg/mL 637   She is on daily OTC MVI Both levels stable and at goal  4. Pure hypercholesterolemia Discussed Labs Lipid Panel     Component Value Date/Time   CHOL 216 (H) 04/22/2023 0932   TRIG 84 04/22/2023 0932   HDL 73 04/22/2023 0932   CHOLHDL 3.5 01/06/2018 1130   CHOLHDL 2.9 05/02/2015 1405   VLDL 20 05/02/2015 1405   LDLCALC 128 (H) 04/22/2023 0932   LABVLDL 15 04/22/2023 0932   The 10-year ASCVD risk score (Arnett DK, et al., 2019) is: 1.5%   Values used to calculate the score:     Age: 58 years     Sex: Female     Is Non-Hispanic African American: No     Diabetic: No     Tobacco smoker: No     Systolic Blood Pressure: 109 mmHg     Is BP treated: No     HDL Cholesterol: 73 mg/dL     Total Cholesterol:  216 mg/dL   She estimattes to consume red meat 2 x week ASCVD Risk Stratification is acceptable  5. Emotional Eating Behavior 04/27/2023 Wellbutrin SR increased from 150mg  to 200mg  BP stable at OV She endorses decreased cravings since increasing dose  Assessment/Plan:   1. Tooth pain Recent Root Canal  2. Pre-diabetes (Primary) Refill metFORMIN (GLUCOPHAGE) 500 MG tablet Take 1 tablet (500 mg total) by mouth 2 (two) times daily with a meal. Dispense: 60 tablet, Refills: 0 ordered  May take BID or both tabs in am  3. Vitamin D deficiency Continue OTC MVI  4. Pure hypercholesterolemia Reduce sat fat intake  5. Emotional Eating Behavior Refill buPROPion (WELLBUTRIN SR) 200 MG 12 hr tablet Take 1 tablet (200 mg total) by  mouth daily. Dispense: 30 tablet, Refills: 0 ordered   6. BMI 32.0-32.9,adult, Current BMI 32.3  Lottie is currently in the action stage of change. As such, her goal is to continue with weight loss efforts. She has agreed to keeping a food journal and adhering to recommended goals of 1200-1400 calories and 80+ protein.   Exercise goals: For substantial health benefits, adults should do at least 150 minutes (2 hours and 30 minutes) a week of moderate-intensity, or 75 minutes (1 hour and 15 minutes) a week of vigorous-intensity aerobic physical activity, or an equivalent combination of moderate- and vigorous-intensity aerobic activity. Aerobic activity should be performed in episodes of at least 10 minutes, and preferably, it should be spread throughout the week.  Behavioral modification strategies: increasing lean protein intake, decreasing simple carbohydrates, increasing vegetables, increasing water intake, meal planning and cooking strategies, keeping healthy foods in the home, ways to avoid boredom eating, ways to avoid night time snacking, and planning for success.  Ariel Ellis has agreed to follow-up with our clinic in 4 weeks. She was informed of the importance of frequent follow-up visits to maximize her success with intensive lifestyle modifications for her multiple health conditions.   Objective:   Blood pressure 109/74, pulse 77, temperature 98.1 F (36.7 C), height 5\' 5"  (1.651 m), weight 194 lb (88 kg), last menstrual period 04/05/2012, SpO2 97%. Body mass index is 32.28 kg/m.  General: Cooperative, alert, well developed, in no acute distress. HEENT: Conjunctivae and lids unremarkable. Cardiovascular: Regular rhythm.  Lungs: Normal work of breathing. Neurologic: No focal deficits.   Lab Results  Component Value Date   CREATININE 0.98 04/22/2023   BUN 21 04/22/2023   NA 144 04/22/2023   K 4.7 04/22/2023   CL 107 (H) 04/22/2023   CO2 21 04/22/2023   Lab Results  Component Value Date    ALT 25 04/22/2023   AST 17 04/22/2023   ALKPHOS 90 04/22/2023   BILITOT 0.3 04/22/2023   Lab Results  Component Value Date   HGBA1C 5.9 (H) 04/22/2023   HGBA1C 6.0 (H) 10/28/2022   HGBA1C 5.6 03/11/2022   HGBA1C 5.8 (H) 11/17/2021   HGBA1C 5.8 (H) 06/30/2021   Lab Results  Component Value Date   INSULIN 10.3 04/22/2023   INSULIN 5.1 10/28/2022   INSULIN 4.5 03/11/2022   INSULIN 7.3 11/17/2021   INSULIN 4.9 06/30/2021   Lab Results  Component Value Date   TSH 2.280 03/11/2022   Lab Results  Component Value Date   CHOL 216 (H) 04/22/2023   HDL 73 04/22/2023   LDLCALC 128 (H) 04/22/2023   TRIG 84 04/22/2023   CHOLHDL 3.5 01/06/2018   Lab Results  Component Value Date   VD25OH 56.0 04/22/2023   VD25OH  52.7 10/28/2022   VD25OH 56.6 03/11/2022   Lab Results  Component Value Date   WBC 4.0 03/11/2022   HGB 13.2 03/11/2022   HCT 40.9 03/11/2022   MCV 89 03/11/2022   PLT 230 03/11/2022   No results found for: "IRON", "TIBC", "FERRITIN"  Attestation Statements:   Reviewed by clinician on day of visit: allergies, medications, problem list, medical history, surgical history, family history, social history, and previous encounter notes.  I have reviewed the above documentation for accuracy and completeness, and I agree with the above. -  Benisha Hadaway d. Hannalee Castor, NP-C

## 2023-06-29 ENCOUNTER — Encounter (INDEPENDENT_AMBULATORY_CARE_PROVIDER_SITE_OTHER): Payer: Self-pay | Admitting: Family Medicine

## 2023-06-29 ENCOUNTER — Ambulatory Visit (INDEPENDENT_AMBULATORY_CARE_PROVIDER_SITE_OTHER): Payer: Managed Care, Other (non HMO) | Admitting: Family Medicine

## 2023-06-29 VITALS — BP 116/71 | HR 72 | Temp 97.9°F | Ht 65.0 in | Wt 194.0 lb

## 2023-06-29 DIAGNOSIS — M25559 Pain in unspecified hip: Secondary | ICD-10-CM

## 2023-06-29 DIAGNOSIS — Z6832 Body mass index (BMI) 32.0-32.9, adult: Secondary | ICD-10-CM

## 2023-06-29 DIAGNOSIS — F5089 Other specified eating disorder: Secondary | ICD-10-CM

## 2023-06-29 DIAGNOSIS — F3289 Other specified depressive episodes: Secondary | ICD-10-CM

## 2023-06-29 DIAGNOSIS — R7303 Prediabetes: Secondary | ICD-10-CM

## 2023-06-29 DIAGNOSIS — E669 Obesity, unspecified: Secondary | ICD-10-CM

## 2023-06-29 MED ORDER — BUPROPION HCL ER (SR) 200 MG PO TB12: 200.0000 mg | ORAL_TABLET | Freq: Every day | ORAL | 0 refills | Status: DC

## 2023-06-29 MED ORDER — METFORMIN HCL 500 MG PO TABS: 500.0000 mg | ORAL_TABLET | Freq: Two times a day (BID) | ORAL | 0 refills | Status: DC

## 2023-06-29 NOTE — Progress Notes (Signed)
 Office: [phone]  /  Fax: [phone]  WEIGHT SUMMARY AND BIOMETRICS  Anthropometric Measurements Height: 5\' 5"  (1.651 m) Weight: 194 lb (88 kg) BMI (Calculated): 32.28 Weight at Last Visit: 194 lb Weight Lost Since Last Visit: 0 Weight Gained Since Last Visit: 0 Starting Weight: 217 lb Total Weight Loss (lbs): 23 lb (10.4 kg)   Body Composition  Body Fat %: 41.3 % Fat Mass (lbs): 80.2 lbs Muscle Mass (lbs): 108.2 lbs Total Body Water (lbs): 80 lbs Visceral Fat Rating : 11   Other Clinical Data Today's Visit #: 2 Starting Date: 02/22/18    Chief Complaint: OBESITY    History of Present Illness   Ariel Ellis is a 58 year old female with obesity, prediabetes, and emotional eating behaviors who presents for obesity treatment plan assessment and progress evaluation.  She has been following a dietary plan with a goal of 1200 to 1400 calories and 80 or more grams of protein daily, achieving this about 75% of the time. She engages in various exercises, including walking, weights, and squats, for 50 to 90 minutes five times per week. Despite a challenging month with a week of travel, she has maintained her weight since the last visit and has lost a total of 23 pounds since her first visit. She wants to lose an additional 15 pounds due to discomfort in her feet and hips.  She has a history of prediabetes and is taking metformin, which she remembers to take at least six days a week.  She has a history of emotional eating behaviors and is taking Minitran to manage this.  Her feet and hips hurt at the end of the day, which she associates with her current weight. She did not experience these body aches 15 pounds ago. She experiences hip pain, particularly on the side, which she describes as more of a bursitis or inflammation. This pain affects her sleep, especially when it flares up.  She takes vitamins and tries to include vegetables in her diet, though she acknowledges  not having a completely balanced diet. She mentions eating seafood with pasta and prioritizing protein and vegetables when making food choices. She also notes that she has had only one apple in the past month. She finds it challenging to eat three meals a day and feels that reducing her calorie intake below 1000 calories, with the help of protein shakes, might aid in weight loss. She has difficulty maintaining long-term focus on strict dietary plans and prefers alternating between strict journaling and mindful eating.          PHYSICAL EXAM:  Blood pressure 116/71, pulse 72, temperature 97.9 F (36.6 C), height 5\' 5"  (1.651 m), weight 194 lb (88 kg), last menstrual period 04/05/2012, SpO2 98%. Body mass index is 32.28 kg/m.  DIAGNOSTIC DATA REVIEWED:  BMET    Component Value Date/Time   NA 144 04/22/2023 0932   K 4.7 04/22/2023 0932   CL 107 (H) 04/22/2023 0932   CO2 21 04/22/2023 0932   GLUCOSE 107 (H) 04/22/2023 0932   GLUCOSE 81 05/02/2015 1405   BUN 21 04/22/2023 0932   CREATININE 0.98 04/22/2023 0932   CREATININE 0.70 05/02/2015 1405   CALCIUM 9.0 04/22/2023 0932   GFRNONAA 78 05/19/2020 0849   GFRAA 90 05/19/2020 0849   Lab Results  Component Value Date   HGBA1C 5.9 (H) 04/22/2023   HGBA1C 5.8 (H) 02/22/2018   Lab Results  Component Value Date   INSULIN 10.3 04/22/2023  INSULIN 13.1 02/22/2018   Lab Results  Component Value Date   TSH 2.280 03/11/2022   CBC    Component Value Date/Time   WBC 4.0 03/11/2022 0824   WBC 5.7 05/02/2015 1405   RBC 4.62 03/11/2022 0824   RBC 4.64 05/02/2015 1405   HGB 13.2 03/11/2022 0824   HGB 13.1 05/17/2013 1518   HCT 40.9 03/11/2022 0824   PLT 230 03/11/2022 0824   MCV 89 03/11/2022 0824   MCH 28.6 03/11/2022 0824   MCH 28.4 05/02/2015 1405   MCHC 32.3 03/11/2022 0824   MCHC 32.8 05/02/2015 1405   RDW 13.2 03/11/2022 0824   Iron Studies No results found for: "IRON", "TIBC", "FERRITIN", "IRONPCTSAT" Lipid Panel      Component Value Date/Time   CHOL 216 (H) 04/22/2023 0932   TRIG 84 04/22/2023 0932   HDL 73 04/22/2023 0932   CHOLHDL 3.5 01/06/2018 1130   CHOLHDL 2.9 05/02/2015 1405   VLDL 20 05/02/2015 1405   LDLCALC 128 (H) 04/22/2023 0932   Hepatic Function Panel     Component Value Date/Time   PROT 6.1 04/22/2023 0932   ALBUMIN 4.3 04/22/2023 0932   AST 17 04/22/2023 0932   ALT 25 04/22/2023 0932   ALKPHOS 90 04/22/2023 0932   BILITOT 0.3 04/22/2023 0932      Component Value Date/Time   TSH 2.280 03/11/2022 0824   Nutritional Lab Results  Component Value Date   VD25OH 56.0 04/22/2023   VD25OH 52.7 10/28/2022   VD25OH 56.6 03/11/2022     Assessment and Plan    Obesity She has lost 23 pounds since the initial visit and maintained her weight since the last visit. She consumes 1200 to 1400 calories with a goal of 80 grams of protein daily, achieving this 75% of the time. She exercises 50 to 90 minutes five times per week. She desires to lose an additional 15 pounds due to hip and foot pain associated with her current weight. She is considering reducing her calorie intake below 1000 calories, acknowledging the risk of malnutrition. Discussed the importance of maintaining protein intake and monitoring for muscle mass loss. She plans to alternate between strict journaling and mindful eating weeks. - Continue current dietary plan with a focus on protein intake. - Experiment with reducing calorie intake below 1000 calories while maintaining 80 grams of protein daily. - Monitor for signs of malnutrition and muscle mass loss. - Encourage journaling and mindful eating strategies.  Hip Pain She reports hip pain, particularly on the side, possibly related to bursitis, exacerbated by certain activities and associated with her current weight. Discussed potential inflammation and further evaluation if pain persists. - Consider further evaluation if hip pain persists.  Prediabetes She is  currently taking metformin and reports consistent adherence with occasional missed doses. - Continue metformin as prescribed. - Prescribe 90-day supply of metformin.  Emotional Eating Behaviors She is taking bupropion (Minitran) to manage emotional eating behaviors and reports consistent adherence. - Continue bupropion as prescribed. - Prescribe 90-day supply of bupropion.  General Health Maintenance Advised to maintain a balanced diet with adequate fruits and vegetables while avoiding high-calorie additives. Emphasized the importance of sleep and stress management. - Encourage consumption of fruits and vegetables while avoiding high-calorie additives. - Ensure adequate sleep and manage stress.        She was informed of the importance of frequent follow up visits to maximize her success with intensive lifestyle modifications for her multiple health conditions.    Carvel Huskins  Dalbert Garnet, MD

## 2023-07-27 ENCOUNTER — Ambulatory Visit (INDEPENDENT_AMBULATORY_CARE_PROVIDER_SITE_OTHER): Payer: BC Managed Care – PPO | Admitting: Family Medicine

## 2023-07-27 ENCOUNTER — Encounter (INDEPENDENT_AMBULATORY_CARE_PROVIDER_SITE_OTHER): Payer: Self-pay | Admitting: Family Medicine

## 2023-07-27 VITALS — BP 120/77 | HR 80 | Temp 98.0°F | Ht 65.0 in | Wt 190.0 lb

## 2023-07-27 DIAGNOSIS — R7303 Prediabetes: Secondary | ICD-10-CM

## 2023-07-27 DIAGNOSIS — Z6831 Body mass index (BMI) 31.0-31.9, adult: Secondary | ICD-10-CM

## 2023-07-27 DIAGNOSIS — F5089 Other specified eating disorder: Secondary | ICD-10-CM

## 2023-07-27 DIAGNOSIS — E669 Obesity, unspecified: Secondary | ICD-10-CM

## 2023-07-27 DIAGNOSIS — F3289 Other specified depressive episodes: Secondary | ICD-10-CM

## 2023-07-27 NOTE — Progress Notes (Signed)
 Office: [phone]  /  Fax: [phone]  WEIGHT SUMMARY AND BIOMETRICS  Anthropometric Measurements Height: 5\' 5"  (1.651 m) Weight: 190 lb (86.2 kg) BMI (Calculated): 31.62 Weight at Last Visit: 194 LB Weight Lost Since Last Visit: 4 lb Weight Gained Since Last Visit: 0 Starting Weight: 217 LB Total Weight Loss (lbs): 27 lb (12.2 kg)   Body Composition  Body Fat %: 40.2 % Fat Mass (lbs): 76.6 lbs Muscle Mass (lbs): 108 lbs Total Body Water (lbs): 79 lbs Visceral Fat Rating : 10   Other Clinical Data Fasting: NO Labs: NO Today's Visit #: 19 Starting Date: 02/22/18    Chief Complaint: OBESITY    History of Present Illness Ariel Ellis is a 58 year old female with obesity who presents for obesity treatment and progress assessment.  She is following a diet plan targeting 1200 to 1400 calories and at least 80 grams of protein daily, achieving this approximately 75% of the time. Over the past month, she has lost 4 pounds.  Her exercise regimen includes 45 to 50 minutes of cardio and strength training five times per week. She incorporates stretching exercises to address stiffness, particularly in her back and shoulders.  She is taking metformin  and Wellbutrin . She reports no issues with Wellbutrin  but has been inconsistent with the second dose of metformin , taking it only four times in the last three weeks. She has not experienced any stomach upset with metformin .  She has a history of emotional eating behaviors and is working on strategies to avoid turning to food for comfort. Journaling helps her recognize patterns in her eating habits.  She manages stress by walking and listening to audiobooks, which she considers her current hobby due to her busy schedule. She has a cleaning lady to help manage household tasks.      PHYSICAL EXAM:  Blood pressure 120/77, pulse 80, temperature 98 F (36.7 C), height 5\' 5"  (1.651 m), weight 190 lb (86.2 kg), last menstrual  period 04/05/2012, SpO2 98%. Body mass index is 31.62 kg/m.  DIAGNOSTIC DATA REVIEWED:  BMET    Component Value Date/Time   NA 144 04/22/2023 0932   K 4.7 04/22/2023 0932   CL 107 (H) 04/22/2023 0932   CO2 21 04/22/2023 0932   GLUCOSE 107 (H) 04/22/2023 0932   GLUCOSE 81 05/02/2015 1405   BUN 21 04/22/2023 0932   CREATININE 0.98 04/22/2023 0932   CREATININE 0.70 05/02/2015 1405   CALCIUM 9.0 04/22/2023 0932   GFRNONAA 78 05/19/2020 0849   GFRAA 90 05/19/2020 0849   Lab Results  Component Value Date   HGBA1C 5.9 (H) 04/22/2023   HGBA1C 5.8 (H) 02/22/2018   Lab Results  Component Value Date   INSULIN  10.3 04/22/2023   INSULIN  13.1 02/22/2018   Lab Results  Component Value Date   TSH 2.280 03/11/2022   CBC    Component Value Date/Time   WBC 4.0 03/11/2022 0824   WBC 5.7 05/02/2015 1405   RBC 4.62 03/11/2022 0824   RBC 4.64 05/02/2015 1405   HGB 13.2 03/11/2022 0824   HGB 13.1 05/17/2013 1518   HCT 40.9 03/11/2022 0824   PLT 230 03/11/2022 0824   MCV 89 03/11/2022 0824   MCH 28.6 03/11/2022 0824   MCH 28.4 05/02/2015 1405   MCHC 32.3 03/11/2022 0824   MCHC 32.8 05/02/2015 1405   RDW 13.2 03/11/2022 0824   Iron Studies No results found for: "IRON", "TIBC", "FERRITIN", "IRONPCTSAT" Lipid Panel     Component Value Date/Time  CHOL 216 (H) 04/22/2023 0932   TRIG 84 04/22/2023 0932   HDL 73 04/22/2023 0932   CHOLHDL 3.5 01/06/2018 1130   CHOLHDL 2.9 05/02/2015 1405   VLDL 20 05/02/2015 1405   LDLCALC 128 (H) 04/22/2023 0932   Hepatic Function Panel     Component Value Date/Time   PROT 6.1 04/22/2023 0932   ALBUMIN 4.3 04/22/2023 0932   AST 17 04/22/2023 0932   ALT 25 04/22/2023 0932   ALKPHOS 90 04/22/2023 0932   BILITOT 0.3 04/22/2023 0932      Component Value Date/Time   TSH 2.280 03/11/2022 0824   Nutritional Lab Results  Component Value Date   VD25OH 56.0 04/22/2023   VD25OH 52.7 10/28/2022   VD25OH 56.6 03/11/2022     Assessment  and Plan Assessment & Plan Obesity Obesity management is ongoing with a focus on caloric intake and exercise. She adheres to a 1200-1400 calorie diet with a protein goal of 80 grams or more, achieving this 75% of the time. She exercises 45-50 minutes, five times a week, combining cardio and strength training. She has lost 4 pounds in the last month, indicating progress in weight management. - Continue current diet and exercise regimen. - Encourage journaling to track caloric intake and protein consumption.  Emotional eating behaviors Emotional eating behaviors are managed with Wellbutrin . She is working on strategies to avoid turning to food for comfort. No issues reported with Wellbutrin , and no refills are needed at this time. - Continue Wellbutrin  as prescribed. - Encourage continued use of strategies to manage emotional eating.  Prediabetes Prediabetes is managed with lifestyle modifications and metformin . She reports difficulty with the second dose of metformin  due to a busy schedule. No stomach upset reported with metformin . The option to switch to a once-daily 750 mg dose was discussed but deferred for now due to current life stressors. - Continue metformin  500 mg twice daily as tolerated. - Reassess metformin  dosing if adherence issues persist.   She was informed of the importance of frequent follow up visits to maximize her success with intensive lifestyle modifications for her multiple health conditions.    Jasmine Mesi, MD

## 2023-08-24 ENCOUNTER — Encounter (INDEPENDENT_AMBULATORY_CARE_PROVIDER_SITE_OTHER): Payer: Self-pay | Admitting: Family Medicine

## 2023-08-24 ENCOUNTER — Ambulatory Visit (INDEPENDENT_AMBULATORY_CARE_PROVIDER_SITE_OTHER): Admitting: Family Medicine

## 2023-08-24 VITALS — BP 112/77 | HR 78 | Temp 98.1°F | Ht 65.0 in | Wt 191.0 lb

## 2023-08-24 DIAGNOSIS — F5089 Other specified eating disorder: Secondary | ICD-10-CM | POA: Diagnosis not present

## 2023-08-24 DIAGNOSIS — E669 Obesity, unspecified: Secondary | ICD-10-CM | POA: Diagnosis not present

## 2023-08-24 DIAGNOSIS — E66812 Obesity, class 2: Secondary | ICD-10-CM

## 2023-08-24 DIAGNOSIS — R7303 Prediabetes: Secondary | ICD-10-CM | POA: Diagnosis not present

## 2023-08-24 DIAGNOSIS — Z6831 Body mass index (BMI) 31.0-31.9, adult: Secondary | ICD-10-CM | POA: Diagnosis not present

## 2023-08-24 DIAGNOSIS — F3289 Other specified depressive episodes: Secondary | ICD-10-CM

## 2023-08-24 MED ORDER — METFORMIN HCL 500 MG PO TABS
500.0000 mg | ORAL_TABLET | Freq: Two times a day (BID) | ORAL | 0 refills | Status: AC
Start: 2023-08-24 — End: ?

## 2023-08-24 MED ORDER — BUPROPION HCL ER (SR) 200 MG PO TB12
200.0000 mg | ORAL_TABLET | Freq: Every day | ORAL | 0 refills | Status: AC
Start: 2023-08-24 — End: ?

## 2023-08-24 NOTE — Progress Notes (Signed)
 Office: [phone]  /  Fax: [phone]  WEIGHT SUMMARY AND BIOMETRICS  Anthropometric Measurements Height: 5\' 5"  (1.651 m) Weight: 191 lb (86.6 kg) BMI (Calculated): 31.78 Weight at Last Visit: 190 lb Weight Lost Since Last Visit: 0 Weight Gained Since Last Visit: 1 lb Starting Weight: 217 lb Total Weight Loss (lbs): 26 lb (11.8 kg) Peak Weight: 225 lb   Body Composition  Body Fat %: 41 % Fat Mass (lbs): 78.4 lbs Muscle Mass (lbs): 107 lbs Total Body Water (lbs): 80.4 lbs Visceral Fat Rating : 10   Other Clinical Data Fasting: yes Labs: no Today's Visit #: 32 Starting Date: 02/22/18    Chief Complaint: OBESITY    History of Present Illness Ariel Ellis is a 58 year old female with obesity and prediabetes who presents for obesity treatment and progress assessment.  She is following a journaling eating plan with a calorie goal of 1400 and a protein goal of 80 grams or more per day, with adherence estimated at 75%. Despite these efforts, she has gained one pound in the last month. She feels she is doing well with protein intake but struggles more with meeting her calorie goals, particularly in the evenings. She journals her food intake inconsistently, acknowledging that she does better when she journals.  She engages in regular physical activity, walking three miles and doing weight training, which takes about 50 minutes per session. This exercise routine is a consistent part of her schedule.  She experiences significant stress due to work, impacting her sleep and leading to some comfort eating. Recent family events, such as a niece's graduation, Mother's Day, and her mother's birthday, have increased temptations, making adherence to her eating plan more challenging.  She is currently taking metformin  and bupropion . She reports difficulty in consistently taking the second dose of metformin  due to her busy schedule, although she keeps a pill case with her. She uses  an alarm on her phone to remind her to take the medication, but finds it challenging when she is busy or away from her usual routine.      PHYSICAL EXAM:  Blood pressure 112/77, pulse 78, temperature 98.1 F (36.7 C), height 5\' 5"  (1.651 m), weight 191 lb (86.6 kg), last menstrual period 04/05/2012, SpO2 96%. Body mass index is 31.78 kg/m.  DIAGNOSTIC DATA REVIEWED:  BMET    Component Value Date/Time   NA 144 04/22/2023 0932   K 4.7 04/22/2023 0932   CL 107 (H) 04/22/2023 0932   CO2 21 04/22/2023 0932   GLUCOSE 107 (H) 04/22/2023 0932   GLUCOSE 81 05/02/2015 1405   BUN 21 04/22/2023 0932   CREATININE 0.98 04/22/2023 0932   CREATININE 0.70 05/02/2015 1405   CALCIUM 9.0 04/22/2023 0932   GFRNONAA 78 05/19/2020 0849   GFRAA 90 05/19/2020 0849   Lab Results  Component Value Date   HGBA1C 5.9 (H) 04/22/2023   HGBA1C 5.8 (H) 02/22/2018   Lab Results  Component Value Date   INSULIN  10.3 04/22/2023   INSULIN  13.1 02/22/2018   Lab Results  Component Value Date   TSH 2.280 03/11/2022   CBC    Component Value Date/Time   WBC 4.0 03/11/2022 0824   WBC 5.7 05/02/2015 1405   RBC 4.62 03/11/2022 0824   RBC 4.64 05/02/2015 1405   HGB 13.2 03/11/2022 0824   HGB 13.1 05/17/2013 1518   HCT 40.9 03/11/2022 0824   PLT 230 03/11/2022 0824   MCV 89 03/11/2022 0824   MCH 28.6  03/11/2022 0824   MCH 28.4 05/02/2015 1405   MCHC 32.3 03/11/2022 0824   MCHC 32.8 05/02/2015 1405   RDW 13.2 03/11/2022 0824   Iron Studies No results found for: "IRON", "TIBC", "FERRITIN", "IRONPCTSAT" Lipid Panel     Component Value Date/Time   CHOL 216 (H) 04/22/2023 0932   TRIG 84 04/22/2023 0932   HDL 73 04/22/2023 0932   CHOLHDL 3.5 01/06/2018 1130   CHOLHDL 2.9 05/02/2015 1405   VLDL 20 05/02/2015 1405   LDLCALC 128 (H) 04/22/2023 0932   Hepatic Function Panel     Component Value Date/Time   PROT 6.1 04/22/2023 0932   ALBUMIN 4.3 04/22/2023 0932   AST 17 04/22/2023 0932   ALT 25  04/22/2023 0932   ALKPHOS 90 04/22/2023 0932   BILITOT 0.3 04/22/2023 0932      Component Value Date/Time   TSH 2.280 03/11/2022 0824   Nutritional Lab Results  Component Value Date   VD25OH 56.0 04/22/2023   VD25OH 52.7 10/28/2022   VD25OH 56.6 03/11/2022     Assessment and Plan Assessment & Plan Obesity Obesity management focuses on dietary journaling and exercise. Current regimen includes a calorie goal of 1400 and a protein goal of 80 grams per day, with 75% adherence. Exercise includes walking three miles and weight training for 50 minutes. A one-pound weight gain is likely due to fluid shifts rather than true weight gain. Stress from work and family events contributes to dietary challenges. - Continue dietary journaling and exercise regimen. - Encourage adherence to protein and calorie goals. - Discuss strategies to manage stress-related eating.  Emotional eating behaviors with increased stress Increased stress from work and family events has led to emotional eating behaviors. She acknowledges the impact of stress on eating habits and is working on maintaining focus on dietary goals. Strategies to manage stress and its impact on eating were discussed, including setting boundaries during vacation and maintaining exercise routines. - Encourage stress management techniques. - Maintain exercise routine as a stress management tool. - Discuss setting boundaries during vacation to manage stress.  Prediabetes Prediabetes is managed with metformin  and lifestyle modifications. She reports difficulty adhering to the twice-daily dosing of metformin , particularly the second dose. Strategies such as setting phone alarms and carrying medication have been partially effective. Consideration of increasing the morning dose to 750 mg was discussed but deferred until after the current stressful period. Potential side effects of increased dosage include queasiness, which she has not experienced with  the current dose. - Continue metformin  as prescribed. - Reassess metformin  dosing after current stress period. - Encourage adherence to lifestyle modifications.      She was informed of the importance of frequent follow up visits to maximize her success with intensive lifestyle modifications for her multiple health conditions.    Jasmine Mesi, MD

## 2023-09-21 ENCOUNTER — Encounter (INDEPENDENT_AMBULATORY_CARE_PROVIDER_SITE_OTHER): Payer: Self-pay | Admitting: Family Medicine

## 2023-09-21 ENCOUNTER — Ambulatory Visit (INDEPENDENT_AMBULATORY_CARE_PROVIDER_SITE_OTHER): Admitting: Family Medicine

## 2023-09-21 VITALS — BP 124/84 | HR 79 | Temp 98.2°F | Ht 65.0 in | Wt 193.0 lb

## 2023-09-21 DIAGNOSIS — F3289 Other specified depressive episodes: Secondary | ICD-10-CM

## 2023-09-21 DIAGNOSIS — M2550 Pain in unspecified joint: Secondary | ICD-10-CM

## 2023-09-21 DIAGNOSIS — R7303 Prediabetes: Secondary | ICD-10-CM

## 2023-09-21 DIAGNOSIS — M25559 Pain in unspecified hip: Secondary | ICD-10-CM | POA: Diagnosis not present

## 2023-09-21 DIAGNOSIS — F5089 Other specified eating disorder: Secondary | ICD-10-CM | POA: Diagnosis not present

## 2023-09-21 DIAGNOSIS — Z6832 Body mass index (BMI) 32.0-32.9, adult: Secondary | ICD-10-CM

## 2023-09-21 DIAGNOSIS — M25579 Pain in unspecified ankle and joints of unspecified foot: Secondary | ICD-10-CM

## 2023-09-21 DIAGNOSIS — E66812 Obesity, class 2: Secondary | ICD-10-CM

## 2023-09-21 DIAGNOSIS — Z6831 Body mass index (BMI) 31.0-31.9, adult: Secondary | ICD-10-CM

## 2023-09-21 DIAGNOSIS — E669 Obesity, unspecified: Secondary | ICD-10-CM

## 2023-09-21 MED ORDER — METFORMIN HCL 500 MG PO TABS: 500.0000 mg | ORAL_TABLET | Freq: Two times a day (BID) | ORAL | 0 refills | Status: DC

## 2023-09-21 MED ORDER — BUPROPION HCL ER (SR) 200 MG PO TB12: 200.0000 mg | ORAL_TABLET | Freq: Every day | ORAL | 0 refills | Status: DC

## 2023-09-21 NOTE — Progress Notes (Signed)
 Office: [phone]  /  Fax: [phone]  WEIGHT SUMMARY AND BIOMETRICS  Anthropometric Measurements Height: 5' 5 (1.651 m) Weight: 193 lb (87.5 kg) BMI (Calculated): 32.12 Weight at Last Visit: 191 lb Weight Lost Since Last Visit: 0 Weight Gained Since Last Visit: 2 lb Starting Weight: 217 lb Total Weight Loss (lbs): 24 lb (10.9 kg) Peak Weight: 225 lb   Body Composition  Body Fat %: 40 % Fat Mass (lbs): 77.2 lbs Muscle Mass (lbs): 110 lbs Total Body Water (lbs): 83 lbs Visceral Fat Rating : 10   Other Clinical Data Fasting: no Labs: no Today's Visit #: 75 Starting Date: 02/22/18    Chief Complaint: OBESITY    History of Present Illness Ariel Ellis is a 58 year old female with obesity and prediabetes who presents for obesity treatment plan assessment and progress evaluation.  She has been following a 1200 to 1400 calorie journaling plan with 80 or more grams of protein about 75% of the time. She resumed walking daily during her vacation and has returned to her strengthening exercise routine. She is taking metformin  500 mg twice a day for prediabetes management.  She has a diagnosis of emotional eating behaviors and is working on strategies to reduce this. She is taking Wellbutrin  SR 200 mg daily for emotional eating.  She traveled for two weeks, including work travel and a vacation with her sister. During her vacation, she resumed walking daily. She attributes recent weight gain to water retention and notes increased mental capacity to focus on her health after completing a big work project.  She experiences nocturnal pain in her hips, ankles, and feet, ongoing for about three weeks. The pain occurs after sitting down at night and improves with movement. She takes Advil for pain relief, which helps after about an hour and a half. No knee pain reported.  Family history includes her mother having severe arthritis and a condition where bone protrudes through her  ankle. She is unsure if her symptoms are related to arthritis, as her mother's condition started in her knees.      PHYSICAL EXAM:  Blood pressure 124/84, pulse 79, temperature 98.2 F (36.8 C), height 5' 5 (1.651 m), weight 193 lb (87.5 kg), last menstrual period 04/05/2012, SpO2 98%. Body mass index is 32.12 kg/m.  DIAGNOSTIC DATA REVIEWED:  BMET    Component Value Date/Time   NA 144 04/22/2023 0932   K 4.7 04/22/2023 0932   CL 107 (H) 04/22/2023 0932   CO2 21 04/22/2023 0932   GLUCOSE 107 (H) 04/22/2023 0932   GLUCOSE 81 05/02/2015 1405   BUN 21 04/22/2023 0932   CREATININE 0.98 04/22/2023 0932   CREATININE 0.70 05/02/2015 1405   CALCIUM 9.0 04/22/2023 0932   GFRNONAA 78 05/19/2020 0849   GFRAA 90 05/19/2020 0849   Lab Results  Component Value Date   HGBA1C 5.9 (H) 04/22/2023   HGBA1C 5.8 (H) 02/22/2018   Lab Results  Component Value Date   INSULIN  10.3 04/22/2023   INSULIN  13.1 02/22/2018   Lab Results  Component Value Date   TSH 2.280 03/11/2022   CBC    Component Value Date/Time   WBC 4.0 03/11/2022 0824   WBC 5.7 05/02/2015 1405   RBC 4.62 03/11/2022 0824   RBC 4.64 05/02/2015 1405   HGB 13.2 03/11/2022 0824   HGB 13.1 05/17/2013 1518   HCT 40.9 03/11/2022 0824   PLT 230 03/11/2022 0824   MCV 89 03/11/2022 0824   MCH 28.6  03/11/2022 0824   MCH 28.4 05/02/2015 1405   MCHC 32.3 03/11/2022 0824   MCHC 32.8 05/02/2015 1405   RDW 13.2 03/11/2022 0824   Iron Studies No results found for: IRON, TIBC, FERRITIN, IRONPCTSAT Lipid Panel     Component Value Date/Time   CHOL 216 (H) 04/22/2023 0932   TRIG 84 04/22/2023 0932   HDL 73 04/22/2023 0932   CHOLHDL 3.5 01/06/2018 1130   CHOLHDL 2.9 05/02/2015 1405   VLDL 20 05/02/2015 1405   LDLCALC 128 (H) 04/22/2023 0932   Hepatic Function Panel     Component Value Date/Time   PROT 6.1 04/22/2023 0932   ALBUMIN 4.3 04/22/2023 0932   AST 17 04/22/2023 0932   ALT 25 04/22/2023 0932    ALKPHOS 90 04/22/2023 0932   BILITOT 0.3 04/22/2023 0932      Component Value Date/Time   TSH 2.280 03/11/2022 0824   Nutritional Lab Results  Component Value Date   VD25OH 56.0 04/22/2023   VD25OH 52.7 10/28/2022   VD25OH 56.6 03/11/2022     Assessment and Plan Assessment & Plan Obesity She follows a 1200-1400 calorie journaling plan with 80+ grams of protein approximately 75% of the time. She has not engaged in recent exercise and has gained two pounds in the last two months. She is open to structured diet plans, considering pescatarian or vegetarian options. Emphasized the importance of journaling, meal planning, macronutrient distribution, and real food protein over supplements. Encouraged strengthening exercises to boost metabolism. Discussed spreading protein intake throughout the day and the potential impact of protein supplements on weight loss due to the glycemic effect of food. - Continue 1200-1400 calorie journaling plan with 80+ grams of protein - Consider structured diet plans (pescatarian or vegetarian) - Incorporate strengthening exercises three times a week for 30-40 minutes - Monitor macronutrient distribution and keep sugar intake below 50 grams per day - Spread protein intake throughout the day, aiming for 30 grams per meal  Prediabetes She is managing prediabetes through diet, exercise, and weight loss, currently on metformin  500 mg twice daily. Discussed the importance of a balanced diet and regular exercise for blood glucose management. - Continue metformin  500 mg twice daily - Emphasize diet and exercise for blood glucose management  Emotional Eating Behaviors She has emotional eating behaviors and is on Wellbutrin  SR 200 mg daily. Discussed strategies to manage emotional eating and the importance of a structured diet plan. - Continue Wellbutrin  SR 200 mg daily  Multiple Joint Pain Reports hip, ankle, and foot pain, particularly at night, ongoing for about  three weeks. Pain improves with movement. Suspects possible arthritis or other musculoskeletal issues. Recommended referral to sports medicine for further evaluation. Discussed potential need for muscle relaxers and the importance of addressing pain to improve sleep quality. - Refer to Dr. Sharren Decree in sports medicine for evaluation of multiple joint pain - Consider short-term use of NSAIDs or muscle relaxers to improve sleep quality  General Health Maintenance Emphasized the importance of adequate sleep for metabolism and neurotransmitter balance, affecting cravings and overall health. - Ensure adequate sleep to support metabolism and reduce cravings  Follow-up She has follow-up appointments scheduled for July 31st and August. Suggested scheduling an additional appointment in September to continue monitoring progress. - Schedule follow-up appointment in September     She was informed of the importance of frequent follow up visits to maximize her success with intensive lifestyle modifications for her multiple health conditions.    Jasmine Mesi, MD

## 2023-09-22 NOTE — Progress Notes (Unsigned)
   Ariel Muck, PhD, LAT, ATC acting as a scribe for Ariel Juniper, MD.  Ariel Ellis is a 58 y.o. female who presents to [facility] at [facility] today for multiple myalgias ongoing for about 3 weeks. Areas of pain include; hips, ankles, and feet. CC is bilat hip pain, L>R. Pt locates pain to the posterior aspect of the hip, within the buttock. Pain is also all over her feet. Pain is disturbing her sleep at night. Stiffness when 1st transitioning to stand.  She notes family hx of OA.   Aggravates: worse at night Treatments tried: IBU  Pertinent review of systems: ***  Relevant historical information: ***   Exam:  LMP 04/05/2012 (Approximate)  General: Well Developed, well nourished, and in no acute distress.   MSK: ***    Lab and Radiology Results No results found for this or any previous visit (from the past 72 hours). No results found.     Assessment and Plan: 58 y.o. female with ***   PDMP not reviewed this encounter. No orders of the defined types were placed in this encounter.  No orders of the defined types were placed in this encounter.    Discussed warning signs or symptoms. Please see discharge instructions. Patient expresses understanding.   ***

## 2023-09-23 ENCOUNTER — Ambulatory Visit: Payer: Self-pay | Admitting: Family Medicine

## 2023-09-23 ENCOUNTER — Ambulatory Visit

## 2023-09-23 VITALS — BP 136/86 | HR 76 | Ht 65.0 in | Wt 199.0 lb

## 2023-09-23 DIAGNOSIS — M25552 Pain in left hip: Secondary | ICD-10-CM

## 2023-09-23 DIAGNOSIS — G8929 Other chronic pain: Secondary | ICD-10-CM

## 2023-09-23 DIAGNOSIS — M25551 Pain in right hip: Secondary | ICD-10-CM

## 2023-09-23 DIAGNOSIS — M79672 Pain in left foot: Secondary | ICD-10-CM

## 2023-09-23 DIAGNOSIS — M2550 Pain in unspecified joint: Secondary | ICD-10-CM

## 2023-09-23 DIAGNOSIS — M545 Low back pain, unspecified: Secondary | ICD-10-CM

## 2023-09-23 DIAGNOSIS — M79671 Pain in right foot: Secondary | ICD-10-CM

## 2023-09-23 LAB — SEDIMENTATION RATE: Sed Rate: 14 mm/h (ref 0–30)

## 2023-09-23 NOTE — Patient Instructions (Addendum)
 Thank you for coming in today.   Please get an Xray today before you leave   I've referred you to Physical Therapy.  Let us  know if you don't hear from them in one week.   Arch straps  Please get labs today before you leave   Check back around the end of July

## 2023-09-26 LAB — ANA: Anti Nuclear Antibody (ANA): NEGATIVE

## 2023-09-26 LAB — RHEUMATOID FACTOR: Rheumatoid fact SerPl-aCnc: <10 [IU]/mL (ref <14)

## 2023-09-26 LAB — CYCLIC CITRUL PEPTIDE ANTIBODY, IGG: Cyclic Citrullin Peptide Ab: <16 U

## 2023-09-27 ENCOUNTER — Ambulatory Visit: Payer: Self-pay | Admitting: Family Medicine

## 2023-09-27 NOTE — Progress Notes (Signed)
Rheumatology labs are normal

## 2023-10-03 NOTE — Progress Notes (Signed)
 Bilateral hip x-ray looks okay to radiology.

## 2023-10-03 NOTE — Progress Notes (Signed)
Right foot x-ray looks okay to radiology.

## 2023-10-03 NOTE — Progress Notes (Signed)
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

## 2023-10-03 NOTE — Progress Notes (Signed)
Left foot x-ray looks normal to radiology

## 2023-10-05 ENCOUNTER — Ambulatory Visit: Attending: Family Medicine | Admitting: Physical Therapy

## 2023-10-05 ENCOUNTER — Other Ambulatory Visit: Payer: Self-pay

## 2023-10-05 ENCOUNTER — Encounter: Payer: Self-pay | Admitting: Physical Therapy

## 2023-10-05 DIAGNOSIS — M6281 Muscle weakness (generalized): Secondary | ICD-10-CM | POA: Diagnosis present

## 2023-10-05 DIAGNOSIS — M79671 Pain in right foot: Secondary | ICD-10-CM | POA: Diagnosis present

## 2023-10-05 DIAGNOSIS — M79672 Pain in left foot: Secondary | ICD-10-CM | POA: Insufficient documentation

## 2023-10-05 DIAGNOSIS — M25551 Pain in right hip: Secondary | ICD-10-CM | POA: Insufficient documentation

## 2023-10-05 DIAGNOSIS — M25552 Pain in left hip: Secondary | ICD-10-CM | POA: Diagnosis present
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
## 2023-10-10 ENCOUNTER — Encounter: Payer: Self-pay | Admitting: Physical Therapy

## 2023-10-10 ENCOUNTER — Ambulatory Visit: Admitting: Physical Therapy

## 2023-10-10 DIAGNOSIS — M25552 Pain in left hip: Secondary | ICD-10-CM

## 2023-10-10 DIAGNOSIS — M79672 Pain in left foot: Secondary | ICD-10-CM

## 2023-10-10 DIAGNOSIS — M25551 Pain in right hip: Secondary | ICD-10-CM

## 2023-10-10 DIAGNOSIS — M79671 Pain in right foot: Secondary | ICD-10-CM

## 2023-10-10 DIAGNOSIS — M6281 Muscle weakness (generalized): Secondary | ICD-10-CM
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
## 2023-10-25 ENCOUNTER — Ambulatory Visit: Admitting: Physical Therapy

## 2023-10-25 ENCOUNTER — Encounter: Payer: Self-pay | Admitting: Physical Therapy

## 2023-10-25 DIAGNOSIS — M79671 Pain in right foot: Secondary | ICD-10-CM

## 2023-10-25 DIAGNOSIS — M25552 Pain in left hip: Secondary | ICD-10-CM

## 2023-10-25 DIAGNOSIS — M25551 Pain in right hip: Secondary | ICD-10-CM

## 2023-10-25 DIAGNOSIS — M79672 Pain in left foot: Secondary | ICD-10-CM

## 2023-10-25 DIAGNOSIS — M6281 Muscle weakness (generalized): Secondary | ICD-10-CM
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
## 2023-10-27 ENCOUNTER — Ambulatory Visit: Admitting: Physical Therapy

## 2023-10-27 ENCOUNTER — Encounter: Payer: Self-pay | Admitting: Physical Therapy

## 2023-10-27 DIAGNOSIS — M25551 Pain in right hip: Secondary | ICD-10-CM

## 2023-10-27 DIAGNOSIS — M79672 Pain in left foot: Secondary | ICD-10-CM

## 2023-10-27 DIAGNOSIS — M79671 Pain in right foot: Secondary | ICD-10-CM

## 2023-10-27 DIAGNOSIS — M25552 Pain in left hip: Secondary | ICD-10-CM | POA: Diagnosis not present

## 2023-10-27 DIAGNOSIS — M6281 Muscle weakness (generalized): Secondary | ICD-10-CM
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
## 2023-10-28 ENCOUNTER — Encounter: Payer: Self-pay | Admitting: Family Medicine

## 2023-10-28 ENCOUNTER — Ambulatory Visit: Admitting: Family Medicine

## 2023-10-28 VITALS — BP 110/78 | HR 73 | Ht 65.0 in | Wt 198.0 lb

## 2023-10-28 DIAGNOSIS — M25552 Pain in left hip: Secondary | ICD-10-CM

## 2023-10-28 DIAGNOSIS — M25551 Pain in right hip: Secondary | ICD-10-CM

## 2023-10-28 NOTE — Patient Instructions (Signed)
 Thank you for coming in today.   Continue physical therapy.  Let us  know if this does not work well enough.

## 2023-10-28 NOTE — Progress Notes (Signed)
   I, Leotis Batter, CMA acting as a scribe for Artist Lloyd, MD.  Ariel Ellis is a 58 y.o. female who presents to [facility] today for f/u low back, bilat hips pain, and multiple myalgias. Pt was last seen by Dr. Lloyd on 09/23/23 and we obtained labs, and was referred to PT, completing 4 visits.   Today, pt reports some improvement of hip sx with PT. Continues to have myalgia. Hip pain continues to be exacerbated by ascending stairs with weight. Some muscle soreness with PT and HEP. Doing HEP most days of the week. No questions about HEP today. Taking IBU some days.   Dx testing: 09/23/23 R & L foot, bilat hips, and L-spine XR and labs  Pertinent review of systems: No fevers or chills  Relevant historical information: Vitamin D  deficiency.   Exam:  BP 110/78   Pulse 73   Ht 5' 5 (1.651 m)   Wt 198 lb (89.8 kg)   LMP 04/05/2012 (Approximate)   SpO2 97%   BMI 32.95 kg/m  General: Well Developed, well nourished, and in no acute distress.   MSK: Bilateral hips normal-appearing normal motion normal gait.     Assessment and Plan: 58 y.o. female with bilateral hip pain improving with physical therapy but not fully resolved.  Plan to continue out PT and home exercise program.  Recheck back as needed.   PDMP not reviewed this encounter. No orders of the defined types were placed in this encounter.  No orders of the defined types were placed in this encounter.    Discussed warning signs or symptoms. Please see discharge instructions. Patient expresses understanding.   The above documentation has been reviewed and is accurate and complete Artist Lloyd, M.D. Total encounter time 20 minutes including face-to-face time with the patient and, reviewing past medical record, and charting on the date of service.

## 2023-11-03 ENCOUNTER — Ambulatory Visit: Admitting: Physical Therapy

## 2023-11-03 ENCOUNTER — Ambulatory Visit (INDEPENDENT_AMBULATORY_CARE_PROVIDER_SITE_OTHER): Admitting: Family Medicine

## 2023-11-03 ENCOUNTER — Encounter: Payer: Self-pay | Admitting: Physical Therapy

## 2023-11-03 DIAGNOSIS — M25551 Pain in right hip: Secondary | ICD-10-CM

## 2023-11-03 DIAGNOSIS — M25552 Pain in left hip: Secondary | ICD-10-CM | POA: Diagnosis not present

## 2023-11-03 DIAGNOSIS — M6281 Muscle weakness (generalized): Secondary | ICD-10-CM

## 2023-11-03 DIAGNOSIS — M79671 Pain in right foot: Secondary | ICD-10-CM

## 2023-11-03 DIAGNOSIS — M79672 Pain in left foot: Secondary | ICD-10-CM
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
## 2023-11-04 ENCOUNTER — Ambulatory Visit: Admitting: Physical Therapy

## 2023-11-07 ENCOUNTER — Encounter: Payer: Self-pay | Admitting: Physical Therapy

## 2023-11-07 ENCOUNTER — Ambulatory Visit (INDEPENDENT_AMBULATORY_CARE_PROVIDER_SITE_OTHER): Admitting: Family Medicine

## 2023-11-07 ENCOUNTER — Encounter (INDEPENDENT_AMBULATORY_CARE_PROVIDER_SITE_OTHER): Payer: Self-pay | Admitting: Family Medicine

## 2023-11-07 ENCOUNTER — Ambulatory Visit: Attending: Family Medicine | Admitting: Physical Therapy

## 2023-11-07 VITALS — BP 117/74 | HR 75 | Temp 98.3°F | Ht 65.0 in | Wt 193.0 lb

## 2023-11-07 DIAGNOSIS — R7303 Prediabetes: Secondary | ICD-10-CM

## 2023-11-07 DIAGNOSIS — M79671 Pain in right foot: Secondary | ICD-10-CM | POA: Insufficient documentation

## 2023-11-07 DIAGNOSIS — M6281 Muscle weakness (generalized): Secondary | ICD-10-CM | POA: Diagnosis present

## 2023-11-07 DIAGNOSIS — M79672 Pain in left foot: Secondary | ICD-10-CM | POA: Insufficient documentation

## 2023-11-07 DIAGNOSIS — E559 Vitamin D deficiency, unspecified: Secondary | ICD-10-CM | POA: Diagnosis not present

## 2023-11-07 DIAGNOSIS — Z6832 Body mass index (BMI) 32.0-32.9, adult: Secondary | ICD-10-CM

## 2023-11-07 DIAGNOSIS — M25559 Pain in unspecified hip: Secondary | ICD-10-CM

## 2023-11-07 DIAGNOSIS — M25552 Pain in left hip: Secondary | ICD-10-CM | POA: Insufficient documentation

## 2023-11-07 DIAGNOSIS — E785 Hyperlipidemia, unspecified: Secondary | ICD-10-CM

## 2023-11-07 DIAGNOSIS — M25551 Pain in right hip: Secondary | ICD-10-CM | POA: Diagnosis present

## 2023-11-07 DIAGNOSIS — E7800 Pure hypercholesterolemia, unspecified: Secondary | ICD-10-CM

## 2023-11-07 DIAGNOSIS — E669 Obesity, unspecified: Secondary | ICD-10-CM
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
## 2023-11-07 NOTE — Progress Notes (Signed)
 Office: [phone]  /  Fax: [phone]  WEIGHT SUMMARY AND BIOMETRICS  Anthropometric Measurements Height: 5' 5 (1.651 m) Weight: 193 lb (87.5 kg) BMI (Calculated): 32.12 Weight at Last Visit: 193lb Weight Lost Since Last Visit: 0lb Weight Gained Since Last Visit: 0lb Starting Weight: 217lb Total Weight Loss (lbs): 24 lb (10.9 kg) Peak Weight: 225lb   Body Composition  Body Fat %: 41.6 % Fat Mass (lbs): 80.6 lbs Muscle Mass (lbs): 107.2 lbs Total Body Water (lbs): 83.8 lbs Visceral Fat Rating : 11   Other Clinical Data Fasting: No Labs: no Today's Visit #: 65 Starting Date: 02/22/18    Chief Complaint: OBESITY    History of Present Illness Ariel Ellis is a 58 year old female with obesity who presents for a follow-up on her treatment and progress.  She is adhering to a dietary plan of 1200-1400 calories with a goal of consuming 85 or more grams of protein daily, achieving this about 75% of the time. She tracks her intake regularly and incorporates more whole foods, such as fruits and vegetables, into her diet. However, she struggles with protein intake and hydration, occasionally skips meals, and does not consistently achieve seven or more hours of sleep per night.  Her physical activity routine includes exercising 60-75 minutes five days a week, averaging 10,000 steps daily, with a regimen that includes both cardio and strength training. Despite these efforts, her weight has remained stable over the past six weeks.  Business travel poses challenges in controlling food choices and managing sodium intake, but she maintains her weight by returning to her usual eating habits and prioritizing protein intake during travel.  She attends physical therapy for hip and gluteal issues, which has altered her strength training routine. The hip pain has changed in nature but is not severe enough to disrupt sleep. There is a family history of arthritis, but recent evaluations  did not show significant arthritis in her hip, though some was noted in her lower back.  Current medications include metformin  for prediabetes, with no gastrointestinal issues, and a multivitamin for vitamin D  deficiency. No issues with dry mouth from bupropion .      PHYSICAL EXAM:  Blood pressure 117/74, pulse 75, temperature 98.3 F (36.8 C), height 5' 5 (1.651 m), weight 193 lb (87.5 kg), last menstrual period 04/05/2012, SpO2 98%. Body mass index is 32.12 kg/m.  DIAGNOSTIC DATA REVIEWED:  BMET    Component Value Date/Time   NA 144 04/22/2023 0932   K 4.7 04/22/2023 0932   CL 107 (H) 04/22/2023 0932   CO2 21 04/22/2023 0932   GLUCOSE 107 (H) 04/22/2023 0932   GLUCOSE 81 05/02/2015 1405   BUN 21 04/22/2023 0932   CREATININE 0.98 04/22/2023 0932   CREATININE 0.70 05/02/2015 1405   CALCIUM 9.0 04/22/2023 0932   GFRNONAA 78 05/19/2020 0849   GFRAA 90 05/19/2020 0849   Lab Results  Component Value Date   HGBA1C 5.9 (H) 04/22/2023   HGBA1C 5.8 (H) 02/22/2018   Lab Results  Component Value Date   INSULIN  10.3 04/22/2023   INSULIN  13.1 02/22/2018   Lab Results  Component Value Date   TSH 2.280 03/11/2022   CBC    Component Value Date/Time   WBC 4.0 03/11/2022 0824   WBC 5.7 05/02/2015 1405   RBC 4.62 03/11/2022 0824   RBC 4.64 05/02/2015 1405   HGB 13.2 03/11/2022 0824   HGB 13.1 05/17/2013 1518   HCT 40.9 03/11/2022 0824   PLT 230  03/11/2022 0824   MCV 89 03/11/2022 0824   MCH 28.6 03/11/2022 0824   MCH 28.4 05/02/2015 1405   MCHC 32.3 03/11/2022 0824   MCHC 32.8 05/02/2015 1405   RDW 13.2 03/11/2022 0824   Iron Studies No results found for: IRON, TIBC, FERRITIN, IRONPCTSAT Lipid Panel     Component Value Date/Time   CHOL 216 (H) 04/22/2023 0932   TRIG 84 04/22/2023 0932   HDL 73 04/22/2023 0932   CHOLHDL 3.5 01/06/2018 1130   CHOLHDL 2.9 05/02/2015 1405   VLDL 20 05/02/2015 1405   LDLCALC 128 (H) 04/22/2023 0932   Hepatic Function  Panel     Component Value Date/Time   PROT 6.1 04/22/2023 0932   ALBUMIN 4.3 04/22/2023 0932   AST 17 04/22/2023 0932   ALT 25 04/22/2023 0932   ALKPHOS 90 04/22/2023 0932   BILITOT 0.3 04/22/2023 0932      Component Value Date/Time   TSH 2.280 03/11/2022 0824   Nutritional Lab Results  Component Value Date   VD25OH 56.0 04/22/2023   VD25OH 52.7 10/28/2022   VD25OH 56.6 03/11/2022     Assessment and Plan Assessment & Plan Obesity Obesity management is ongoing. She has maintained her weight over the last six weeks despite business travel, adhering to a calorie and protein intake plan about 75% of the time, exercising regularly, and incorporating more whole foods into her diet. Challenges include protein intake, hydration, and consistent sleep. Physical therapy affects her strength training routine. - Continue current dietary plan with focus on increasing protein intake - Encourage hydration and consistent sleep - Continue regular exercise including cardio and strength training - Discussed strategies for maintaining weight during travel - Provided protein pudding recipe to aid in protein intake  Prediabetes Prediabetes is managed with metformin  and lifestyle modifications. She reports no gastrointestinal issues with metformin . Her last glucose level was 107 mg/dL. Labs are due for re-evaluation of her prediabetes status. - Order labs to evaluate glucose levels - Continue metformin  - Continue lifestyle modifications including diet and exercise  Hyperlipidemia Hyperlipidemia is managed with lifestyle modifications. Her LDL was previously elevated at 128 mg/dL, with a goal of under 899 mg/dL. Labs are due to reassess her lipid profile. - Order labs to evaluate lipid profile - Continue lifestyle modifications including diet and exercise  Vitamin D  deficiency, controlled Vitamin D  deficiency is controlled with over-the-counter multivitamins. Her vitamin D  levels will be  re-evaluated with upcoming labs. - Continue multivitamin - Order labs to evaluate vitamin D  levels  Hip pain Hip pain is managed with physical therapy, focusing on the hip and gluteal muscles. The pain has changed in nature but is not severe enough to disrupt sleep. No arthritis was found on recent evaluation by a sports medicine doctor. - Continue physical therapy - Continue diet, exercise and weight loss as discussed today as an important part of her treatment plan       She was informed of the importance of frequent follow up visits to maximize her success with intensive lifestyle modifications for her multiple health conditions.    Ariel Penton, MD

## 2023-11-09 ENCOUNTER — Ambulatory Visit: Admitting: Physical Therapy

## 2023-11-09 ENCOUNTER — Encounter: Payer: Self-pay | Admitting: Physical Therapy

## 2023-11-09 DIAGNOSIS — M25551 Pain in right hip: Secondary | ICD-10-CM

## 2023-11-09 DIAGNOSIS — M6281 Muscle weakness (generalized): Secondary | ICD-10-CM

## 2023-11-09 DIAGNOSIS — M25552 Pain in left hip: Secondary | ICD-10-CM | POA: Diagnosis not present

## 2023-11-09 DIAGNOSIS — M79672 Pain in left foot: Secondary | ICD-10-CM

## 2023-11-09 DIAGNOSIS — M79671 Pain in right foot: Secondary | ICD-10-CM
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
## 2023-11-21 ENCOUNTER — Other Ambulatory Visit: Payer: Self-pay | Admitting: Obstetrics and Gynecology

## 2023-11-21 DIAGNOSIS — Z1231 Encounter for screening mammogram for malignant neoplasm of breast: Secondary | ICD-10-CM

## 2023-11-23 ENCOUNTER — Encounter

## 2023-11-23 DIAGNOSIS — Z1231 Encounter for screening mammogram for malignant neoplasm of breast: Secondary | ICD-10-CM

## 2023-11-28 ENCOUNTER — Ambulatory Visit (INDEPENDENT_AMBULATORY_CARE_PROVIDER_SITE_OTHER): Admitting: Family Medicine

## 2023-11-28 ENCOUNTER — Encounter (INDEPENDENT_AMBULATORY_CARE_PROVIDER_SITE_OTHER): Payer: Self-pay | Admitting: Family Medicine

## 2023-11-28 DIAGNOSIS — F3289 Other specified depressive episodes: Secondary | ICD-10-CM

## 2023-11-28 DIAGNOSIS — Z6832 Body mass index (BMI) 32.0-32.9, adult: Secondary | ICD-10-CM

## 2023-11-28 DIAGNOSIS — F5089 Other specified eating disorder: Secondary | ICD-10-CM | POA: Diagnosis not present

## 2023-11-28 DIAGNOSIS — R7303 Prediabetes: Secondary | ICD-10-CM | POA: Diagnosis not present

## 2023-11-28 DIAGNOSIS — E669 Obesity, unspecified: Secondary | ICD-10-CM | POA: Diagnosis not present

## 2023-11-28 MED ORDER — BUPROPION HCL ER (SR) 200 MG PO TB12: 200.0000 mg | ORAL_TABLET | Freq: Every day | ORAL | 0 refills | Status: DC

## 2023-11-28 MED ORDER — METFORMIN HCL 500 MG PO TABS: 500.0000 mg | ORAL_TABLET | Freq: Two times a day (BID) | ORAL | 0 refills | Status: DC

## 2023-11-28 NOTE — Progress Notes (Signed)
 Office: [phone]  /  Fax: [phone]  WEIGHT SUMMARY AND BIOMETRICS  Anthropometric Measurements Height: 5' 5 (1.651 m) Weight: 194 lb (88 kg) BMI (Calculated): 32.28 Weight at Last Visit: 193 lb Weight Lost Since Last Visit: 0 Weight Gained Since Last Visit: 1 Starting Weight: 217 lb Total Weight Loss (lbs): 23 lb (10.4 kg)   Body Composition  Body Fat %: 40.8 % Fat Mass (lbs): 79.2 lbs Muscle Mass (lbs): 109 lbs Total Body Water (lbs): 81 lbs Visceral Fat Rating : 11   Other Clinical Data Today's Visit #: 17 Starting Date: 02/22/18    Chief Complaint: OBESITY    History of Present Illness Ariel Ellis is a 58 year old female who presents for obesity treatment and progress assessment.  She follows a dietary plan consisting of 1400 to 1600 calories and at least 80 grams of protein daily, adhering to it approximately 75% of the time. She engages in physical activity five days a week, including 60 to 90 minutes of walking, weightlifting, and squats, combining cardio and strength training. Despite these efforts, she has gained one pound over the past three weeks since her last visit.  Her hunger levels remain unchanged, and she is able to consume all the required nutrients, including meeting her protein goals, consuming fruits and vegetables, and staying hydrated. She is not skipping meals.  She is currently taking Wellbutrin  SR 200 mg twice daily for emotional eating behaviors and reports no side effects. She requests a refill of this medication.  She did not complete her blood work as planned due to forgetting on her days off, but intends to do so today. She mentions taking her dog to work most days, which has impacted her ability to visit the lab before work.      PHYSICAL EXAM:  Blood pressure 116/73, pulse 73, temperature 98 F (36.7 C), height 5' 5 (1.651 m), weight 194 lb (88 kg), last menstrual period 04/05/2012, SpO2 98%. Body mass index is 32.28  kg/m.  DIAGNOSTIC DATA REVIEWED:  BMET    Component Value Date/Time   NA 144 04/22/2023 0932   K 4.7 04/22/2023 0932   CL 107 (H) 04/22/2023 0932   CO2 21 04/22/2023 0932   GLUCOSE 107 (H) 04/22/2023 0932   GLUCOSE 81 05/02/2015 1405   BUN 21 04/22/2023 0932   CREATININE 0.98 04/22/2023 0932   CREATININE 0.70 05/02/2015 1405   CALCIUM 9.0 04/22/2023 0932   GFRNONAA 78 05/19/2020 0849   GFRAA 90 05/19/2020 0849   Lab Results  Component Value Date   HGBA1C 5.9 (H) 04/22/2023   HGBA1C 5.8 (H) 02/22/2018   Lab Results  Component Value Date   INSULIN  10.3 04/22/2023   INSULIN  13.1 02/22/2018   Lab Results  Component Value Date   TSH 2.280 03/11/2022   CBC    Component Value Date/Time   WBC 4.0 03/11/2022 0824   WBC 5.7 05/02/2015 1405   RBC 4.62 03/11/2022 0824   RBC 4.64 05/02/2015 1405   HGB 13.2 03/11/2022 0824   HGB 13.1 05/17/2013 1518   HCT 40.9 03/11/2022 0824   PLT 230 03/11/2022 0824   MCV 89 03/11/2022 0824   MCH 28.6 03/11/2022 0824   MCH 28.4 05/02/2015 1405   MCHC 32.3 03/11/2022 0824   MCHC 32.8 05/02/2015 1405   RDW 13.2 03/11/2022 0824   Iron Studies No results found for: IRON, TIBC, FERRITIN, IRONPCTSAT Lipid Panel     Component Value Date/Time   CHOL 216 (  H) 04/22/2023 0932   TRIG 84 04/22/2023 0932   HDL 73 04/22/2023 0932   CHOLHDL 3.5 01/06/2018 1130   CHOLHDL 2.9 05/02/2015 1405   VLDL 20 05/02/2015 1405   LDLCALC 128 (H) 04/22/2023 0932   Hepatic Function Panel     Component Value Date/Time   PROT 6.1 04/22/2023 0932   ALBUMIN 4.3 04/22/2023 0932   AST 17 04/22/2023 0932   ALT 25 04/22/2023 0932   ALKPHOS 90 04/22/2023 0932   BILITOT 0.3 04/22/2023 0932      Component Value Date/Time   TSH 2.280 03/11/2022 0824   Nutritional Lab Results  Component Value Date   VD25OH 56.0 04/22/2023   VD25OH 52.7 10/28/2022   VD25OH 56.6 03/11/2022     Assessment and Plan Assessment & Plan Obesity Obesity management  is ongoing with a calorie-controlled diet of 1400-1600 calories and a protein intake of 80 or more grams per day. She is adhering to the plan approximately 75% of the time. Exercise regimen includes walking and weightlifting five days a week, which has resulted in a gain of muscle mass despite a one-pound weight increase. No changes in hunger levels reported. - Continue current dietary plan with 1400-1600 calories and 80+ grams of protein daily - Maintain exercise routine of walking and weightlifting five days a week  Prediabetes Blood work was not completed as scheduled but will be done today. Metformin  prescription is nearing depletion but not yet exhausted. - Order blood work today - Refill metformin  prescription and send to [facility] at [address] Emotional eating behaviors are being managed with Wellbutrin  SR 200 mg BID. No side effects reported, and she requests a refill. - Refill Wellbutrin  SR 200 mg BID prescription   She was informed of the importance of frequent follow up visits to maximize her success with intensive lifestyle modifications for her multiple health conditions.    Louann Penton, MD

## 2023-11-29 LAB — INSULIN, RANDOM: INSULIN: 7.7 u[IU]/mL (ref 2.6–24.9)

## 2023-11-29 LAB — CMP14+EGFR
ALT: 23 IU/L (ref 0–32)
AST: 17 IU/L (ref 0–40)
Albumin: 4.3 g/dL (ref 3.8–4.9)
Alkaline Phosphatase: 105 IU/L (ref 44–121)
BUN/Creatinine Ratio: 22 (ref 9–23)
BUN: 20 mg/dL (ref 6–24)
Bilirubin Total: 0.2 mg/dL (ref 0.0–1.2)
CO2: 20 mmol/L (ref 20–29)
Calcium: 9.2 mg/dL (ref 8.7–10.2)
Chloride: 104 mmol/L (ref 96–106)
Creatinine, Ser: 0.92 mg/dL (ref 0.57–1.00)
Globulin, Total: 1.9 g/dL (ref 1.5–4.5)
Glucose: 90 mg/dL (ref 70–99)
Potassium: 4.8 mmol/L (ref 3.5–5.2)
Sodium: 140 mmol/L (ref 134–144)
Total Protein: 6.2 g/dL (ref 6.0–8.5)
eGFR: 73 mL/min/1.73 (ref >59)

## 2023-11-29 LAB — HEMOGLOBIN A1C
Est. average glucose Bld gHb Est-mCnc: 117 mg/dL
Hgb A1c MFr Bld: 5.7 % — ABNORMAL HIGH (ref 4.8–5.6)

## 2023-11-29 LAB — LIPID PANEL WITH LDL/HDL RATIO
Cholesterol, Total: 235 mg/dL — ABNORMAL HIGH (ref 100–199)
HDL: 71 mg/dL (ref >39)
LDL Chol Calc (NIH): 142 mg/dL — ABNORMAL HIGH (ref 0–99)
LDL/HDL Ratio: 2 ratio (ref 0.0–3.2)
Triglycerides: 128 mg/dL (ref 0–149)
VLDL Cholesterol Cal: 22 mg/dL (ref 5–40)

## 2023-11-29 LAB — VITAMIN B12: Vitamin B-12: 518 pg/mL (ref 232–1245)

## 2023-11-29 LAB — VITAMIN D 25 HYDROXY (VIT D DEFICIENCY, FRACTURES): Vit D, 25-Hydroxy: 36.7 ng/mL (ref 30.0–100.0)

## 2023-12-26 ENCOUNTER — Encounter (INDEPENDENT_AMBULATORY_CARE_PROVIDER_SITE_OTHER): Payer: Self-pay | Admitting: Family Medicine

## 2023-12-26 ENCOUNTER — Ambulatory Visit (INDEPENDENT_AMBULATORY_CARE_PROVIDER_SITE_OTHER): Admitting: Family Medicine

## 2023-12-26 VITALS — BP 111/73 | HR 77 | Temp 97.9°F | Ht 65.0 in | Wt 195.0 lb

## 2023-12-26 DIAGNOSIS — F5089 Other specified eating disorder: Secondary | ICD-10-CM | POA: Diagnosis not present

## 2023-12-26 DIAGNOSIS — E7800 Pure hypercholesterolemia, unspecified: Secondary | ICD-10-CM | POA: Diagnosis not present

## 2023-12-26 DIAGNOSIS — F3289 Other specified depressive episodes: Secondary | ICD-10-CM

## 2023-12-26 DIAGNOSIS — E559 Vitamin D deficiency, unspecified: Secondary | ICD-10-CM

## 2023-12-26 DIAGNOSIS — E66812 Obesity, class 2: Secondary | ICD-10-CM | POA: Diagnosis not present

## 2023-12-26 DIAGNOSIS — R7303 Prediabetes: Secondary | ICD-10-CM

## 2023-12-26 DIAGNOSIS — Z6832 Body mass index (BMI) 32.0-32.9, adult: Secondary | ICD-10-CM

## 2023-12-26 MED ORDER — BUPROPION HCL ER (SR) 200 MG PO TB12: 200.0000 mg | ORAL_TABLET | Freq: Every day | ORAL | 0 refills | Status: DC

## 2023-12-26 MED ORDER — METFORMIN HCL 500 MG PO TABS: 500.0000 mg | ORAL_TABLET | Freq: Two times a day (BID) | ORAL | 0 refills | Status: DC

## 2023-12-26 MED ORDER — VITAMIN D (ERGOCALCIFEROL) 1.25 MG (50000 UNIT) PO CAPS: 50000.0000 [IU] | ORAL_CAPSULE | ORAL | 0 refills | Status: DC

## 2023-12-26 NOTE — Progress Notes (Signed)
 Office: [phone]  /  Fax: [phone]  WEIGHT SUMMARY AND BIOMETRICS  Anthropometric Measurements Height: 5' 5 (1.651 m) Weight: 195 lb (88.5 kg) BMI (Calculated): 32.45 Weight at Last Visit: 194 lb Weight Lost Since Last Visit: 0 Weight Gained Since Last Visit: 1 lb Starting Weight: 217 lb Total Weight Loss (lbs): 22 lb (9.979 kg) Peak Weight: 225 lb   Body Composition  Body Fat %: 41.6 % Fat Mass (lbs): 81.2 lbs Muscle Mass (lbs): 108 lbs Total Body Water (lbs): 81.8 lbs Visceral Fat Rating : 11   Other Clinical Data Fasting: no Labs: no Today's Visit #: 57 Starting Date: 02/22/18    Chief Complaint: OBESITY    History of Present Illness Ariel Ellis is a 58 year old female with obesity and prediabetes who presents for a follow-up on her obesity treatment and progress.  She is adhering to a prescribed journaling plan with a caloric intake of 1200-1400 calories and a protein goal of 80 grams or more per day, achieving this about 75% of the time. She is consuming more fruits and vegetables but struggles to meet her protein goals. She maintains adequate hydration, avoids skipping meals, and gets 7-9 hours of sleep per night. Her exercise routine includes walking three miles for two hours and incorporating weights six days a week. Despite these efforts, she has gained one pound since her last visit a month ago.  In addition to obesity, she is managing prediabetes with lifestyle modifications including diet, exercise, and weight loss, and is on metformin . Her recent lab results show an improvement in her A1c, now at 5.7, and her insulin  levels are back down under ten. Her fasting glucose was ninety.  She is being treated for emotional eating behavior with Wellbutrin . She experienced two family birthdays and a business trip to [place], which involved non-ideal food choices, but she managed to walk 17,000 steps on two of those days.  Her recent lab results indicate  that her kidneys, liver, and electrolytes are functioning well. Her cholesterol levels show good HDL and triglycerides, but her LDL continues to rise. Her recent labs show vitamin B12 in the 500s and a lower vitamin D  level; she reports currently taking a multivitamin.      PHYSICAL EXAM:  Blood pressure 111/73, pulse 77, temperature 97.9 F (36.6 C), height 5' 5 (1.651 m), weight 195 lb (88.5 kg), last menstrual period 04/05/2012, SpO2 98%. Body mass index is 32.45 kg/m.  DIAGNOSTIC DATA REVIEWED:  BMET    Component Value Date/Time   NA 140 11/28/2023 0918   K 4.8 11/28/2023 0918   CL 104 11/28/2023 0918   CO2 20 11/28/2023 0918   GLUCOSE 90 11/28/2023 0918   GLUCOSE 81 05/02/2015 1405   BUN 20 11/28/2023 0918   CREATININE 0.92 11/28/2023 0918   CREATININE 0.70 05/02/2015 1405   CALCIUM 9.2 11/28/2023 0918   GFRNONAA 78 05/19/2020 0849   GFRAA 90 05/19/2020 0849   Lab Results  Component Value Date   HGBA1C 5.7 (H) 11/28/2023   HGBA1C 5.8 (H) 02/22/2018   Lab Results  Component Value Date   INSULIN  7.7 11/28/2023   INSULIN  13.1 02/22/2018   Lab Results  Component Value Date   TSH 2.280 03/11/2022   CBC    Component Value Date/Time   WBC 4.0 03/11/2022 0824   WBC 5.7 05/02/2015 1405   RBC 4.62 03/11/2022 0824   RBC 4.64 05/02/2015 1405   HGB 13.2 03/11/2022 0824   HGB 13.1  05/17/2013 1518   HCT 40.9 03/11/2022 0824   PLT 230 03/11/2022 0824   MCV 89 03/11/2022 0824   MCH 28.6 03/11/2022 0824   MCH 28.4 05/02/2015 1405   MCHC 32.3 03/11/2022 0824   MCHC 32.8 05/02/2015 1405   RDW 13.2 03/11/2022 0824   Iron Studies No results found for: IRON, TIBC, FERRITIN, IRONPCTSAT Lipid Panel     Component Value Date/Time   CHOL 235 (H) 11/28/2023 0918   TRIG 128 11/28/2023 0918   HDL 71 11/28/2023 0918   CHOLHDL 3.5 01/06/2018 1130   CHOLHDL 2.9 05/02/2015 1405   VLDL 20 05/02/2015 1405   LDLCALC 142 (H) 11/28/2023 0918   Hepatic Function Panel      Component Value Date/Time   PROT 6.2 11/28/2023 0918   ALBUMIN 4.3 11/28/2023 0918   AST 17 11/28/2023 0918   ALT 23 11/28/2023 0918   ALKPHOS 105 11/28/2023 0918   BILITOT 0.2 11/28/2023 0918      Component Value Date/Time   TSH 2.280 03/11/2022 0824   Nutritional Lab Results  Component Value Date   VD25OH 36.7 11/28/2023   VD25OH 56.0 04/22/2023   VD25OH 52.7 10/28/2022     Assessment and Plan Assessment & Plan Obesity, class 2 Obesity management with lifestyle modifications including diet, exercise, and sleep. She follows a calorie-restricted diet (1200-1400 calories, 80+ grams of protein) achieving this 75% of the time. She exercises six days a week, walking three miles and incorporating weights. Despite these efforts, she has gained one pound since the last visit. Challenges include meeting protein goals and managing food choices during social and work events. - Continue current diet and exercise regimen - Encourage increased protein intake - Discussed the use of slow cooker recipes to aid in meal preparation - Consider use of ankle weights or weighted vests to enhance walking workouts  Emotional eating behavior Emotional eating behavior managed with bupropion . She requested a refill of her medication, indicating ongoing management of this condition. - Refill bupropion  prescription  Prediabetes Prediabetes managed with lifestyle modifications and metformin . Recent labs show improvement with A1c at 5.7, insulin  levels below 10, and fasting glucose at 90, indicating good control. - Continue metformin  - Refill metformin  prescription - Continue lifestyle modifications including diet and exercise  Pure hypercholesterolemia Elevated LDL cholesterol despite a low cholesterol diet, likely due to genetic factors. Other lipid parameters are within normal limits. Discussed potential need for medication based on risk assessment by primary care provider. Suggested additional  testing such as coronary artery calcium score to assess cardiovascular risk. - Discuss LDL management with primary care provider - Consider coronary artery calcium score - Continue diet, exercise and weight loss as discussed today as an important part of the treatment plan   Vitamin D  deficiency Vitamin D  levels have decreased, likely due to insufficient dietary intake and possibly genetic factors affecting vitamin D  synthesis. Current supplementation through a multivitamin is inadequate. - Prescribe weekly vitamin D  supplementation - Refill vitamin D  prescription     Georgette was informed of the importance of frequent follow up visits to maximize her success with intensive lifestyle modifications for her obesity and obesity related health conditions as recommended by USPSTF and CMS guidelines   Louann Penton, MD

## 2023-12-27 ENCOUNTER — Other Ambulatory Visit: Payer: Self-pay | Admitting: Obstetrics and Gynecology

## 2023-12-27 DIAGNOSIS — Z1231 Encounter for screening mammogram for malignant neoplasm of breast: Secondary | ICD-10-CM

## 2023-12-28 ENCOUNTER — Inpatient Hospital Stay
Admission: RE | Admit: 2023-12-28 | Discharge: 2023-12-28 | Discharge status: Home / Self Care | Source: Ambulatory Visit

## 2023-12-28 DIAGNOSIS — Z1231 Encounter for screening mammogram for malignant neoplasm of breast: Secondary | ICD-10-CM

## 2024-01-01 ENCOUNTER — Ambulatory Visit: Payer: Self-pay | Admitting: Obstetrics and Gynecology

## 2024-01-23 ENCOUNTER — Encounter (INDEPENDENT_AMBULATORY_CARE_PROVIDER_SITE_OTHER): Payer: Self-pay | Admitting: Family Medicine

## 2024-01-23 ENCOUNTER — Ambulatory Visit (INDEPENDENT_AMBULATORY_CARE_PROVIDER_SITE_OTHER): Payer: Self-pay | Admitting: Family Medicine

## 2024-01-23 VITALS — BP 116/75 | HR 76 | Temp 97.8°F | Ht 65.0 in | Wt 192.0 lb

## 2024-01-23 DIAGNOSIS — R7303 Prediabetes: Secondary | ICD-10-CM | POA: Diagnosis not present

## 2024-01-23 DIAGNOSIS — F5089 Other specified eating disorder: Secondary | ICD-10-CM | POA: Diagnosis not present

## 2024-01-23 DIAGNOSIS — Z6831 Body mass index (BMI) 31.0-31.9, adult: Secondary | ICD-10-CM

## 2024-01-23 DIAGNOSIS — Z6836 Body mass index (BMI) 36.0-36.9, adult: Secondary | ICD-10-CM

## 2024-01-23 DIAGNOSIS — E669 Obesity, unspecified: Secondary | ICD-10-CM

## 2024-01-23 DIAGNOSIS — F3289 Other specified depressive episodes: Secondary | ICD-10-CM

## 2024-01-23 NOTE — Progress Notes (Signed)
 Office: [phone]  /  Fax: [phone]  WEIGHT SUMMARY AND BIOMETRICS  Anthropometric Measurements Height: 5' 5 (1.651 m) Weight: 192 lb (87.1 kg) BMI (Calculated): 31.95 Weight at Last Visit: 192 lb Weight Lost Since Last Visit: 3 lb Weight Gained Since Last Visit: 0 Starting Weight: 217 lb Total Weight Loss (lbs): 25 lb (11.3 kg) Peak Weight: 225 lb   Body Composition  Body Fat %: 41.3 % Fat Mass (lbs): 79.6 lbs Muscle Mass (lbs): 107.4 lbs Total Body Water (lbs): 80 lbs Visceral Fat Rating : 11   Other Clinical Data Fasting: yes Labs: no Today's Visit #: 65 Starting Date: 02/22/18    Chief Complaint: OBESITY  History of Present Illness Ariel Ellis is a 58 year old female with obesity and prediabetes who presents for obesity treatment and progress assessment.  She adheres to a dietary plan targeting 1400 calories and at least 80 grams of protein daily, achieving this approximately 75% of the time. Her diet includes more whole fruits and vegetables, increased protein intake, and improved hydration. She ensures not to skip meals and averages at least seven hours of sleep per night. Her exercise routine consists of walking and weight training for an hour or more, five days a week. She has lost three pounds in the last month since her last visit.  For her prediabetes, she is on metformin  500 mg twice daily. Her most recent labs from November 28, 2023, show a hemoglobin A1c of 5.7. She mentions difficulty remembering the second dose of metformin  due to recent personal and work-related stress.  She is currently experiencing stress due to her husband's job dissatisfaction and her daughter's recent surgery. Her husband changed jobs and dislikes the new position, while her daughter is recovering from surgery and learning to walk in a boot. She is on Wellbutrin , which she finds beneficial for managing stress and anxiety, helping her focus and reducing intrusive thoughts.       PHYSICAL EXAM:  Blood pressure 116/75, pulse 76, temperature 97.8 F (36.6 C), height 5' 5 (1.651 m), weight 192 lb (87.1 kg), last menstrual period 04/05/2012, SpO2 96%. Body mass index is 31.95 kg/m.  DIAGNOSTIC DATA REVIEWED:  BMET    Component Value Date/Time   NA 140 11/28/2023 0918   K 4.8 11/28/2023 0918   CL 104 11/28/2023 0918   CO2 20 11/28/2023 0918   GLUCOSE 90 11/28/2023 0918   GLUCOSE 81 05/02/2015 1405   BUN 20 11/28/2023 0918   CREATININE 0.92 11/28/2023 0918   CREATININE 0.70 05/02/2015 1405   CALCIUM 9.2 11/28/2023 0918   GFRNONAA 78 05/19/2020 0849   GFRAA 90 05/19/2020 0849   Lab Results  Component Value Date   HGBA1C 5.7 (H) 11/28/2023   HGBA1C 5.8 (H) 02/22/2018   Lab Results  Component Value Date   INSULIN  7.7 11/28/2023   INSULIN  13.1 02/22/2018   Lab Results  Component Value Date   TSH 2.280 03/11/2022   CBC    Component Value Date/Time   WBC 4.0 03/11/2022 0824   WBC 5.7 05/02/2015 1405   RBC 4.62 03/11/2022 0824   RBC 4.64 05/02/2015 1405   HGB 13.2 03/11/2022 0824   HGB 13.1 05/17/2013 1518   HCT 40.9 03/11/2022 0824   PLT 230 03/11/2022 0824   MCV 89 03/11/2022 0824   MCH 28.6 03/11/2022 0824   MCH 28.4 05/02/2015 1405   MCHC 32.3 03/11/2022 0824   MCHC 32.8 05/02/2015 1405   RDW 13.2 03/11/2022 0824  Iron Studies No results found for: IRON, TIBC, FERRITIN, IRONPCTSAT Lipid Panel     Component Value Date/Time   CHOL 235 (H) 11/28/2023 0918   TRIG 128 11/28/2023 0918   HDL 71 11/28/2023 0918   CHOLHDL 3.5 01/06/2018 1130   CHOLHDL 2.9 05/02/2015 1405   VLDL 20 05/02/2015 1405   LDLCALC 142 (H) 11/28/2023 0918   Hepatic Function Panel     Component Value Date/Time   PROT 6.2 11/28/2023 0918   ALBUMIN 4.3 11/28/2023 0918   AST 17 11/28/2023 0918   ALT 23 11/28/2023 0918   ALKPHOS 105 11/28/2023 0918   BILITOT 0.2 11/28/2023 0918      Component Value Date/Time   TSH 2.280 03/11/2022 0824    Nutritional Lab Results  Component Value Date   VD25OH 36.7 11/28/2023   VD25OH 56.0 04/22/2023   VD25OH 52.7 10/28/2022     Assessment and Plan Assessment & Plan Obesity Obesity management is ongoing with dietary and exercise interventions. She adheres to a journaling plan with a 1400 calorie goal and a protein intake of 80 grams or more, achieving this 75% of the time. She incorporates more whole fruits and vegetables, increases protein intake, and improves hydration. She does not skip meals and averages seven hours of sleep per night. Exercise includes walking and weight training for an hour or more, five days a week. She has lost three pounds in the last month, indicating progress. - Continue current dietary and exercise regimen - Encourage adherence to journaling plan with calorie and protein goals - Reinforce importance of hydration and sleep - Support use of protein bars and substantial breakfast during market periods  Prediabetes Prediabetes is managed with metformin  500 mg twice daily. Recent labs from November 28, 2023, show an improved hemoglobin A1c of 5.7%. She reports difficulty in consistently taking the second dose of metformin  due to a busy schedule, particularly during market periods. - Continue metformin  500 mg twice daily - Encourage setting reminders for medication adherence  Emotional Eating Behaviors She is well-managed with Wellbutrin , which she reports has been beneficial in reducing stress and improving focus. She experiences less anxiety and finds it easier to manage stressors, particularly during busy periods. - Continue Wellbutrin  as prescribed   Ariel Ellis was informed of the importance of frequent follow up visits to maximize her success with intensive lifestyle modifications for her obesity and obesity related health conditions as recommended by USPSTF and CMS guidelines   Louann Penton, MD

## 2024-02-20 ENCOUNTER — Encounter (INDEPENDENT_AMBULATORY_CARE_PROVIDER_SITE_OTHER): Payer: Self-pay | Admitting: Family Medicine

## 2024-02-20 ENCOUNTER — Ambulatory Visit (INDEPENDENT_AMBULATORY_CARE_PROVIDER_SITE_OTHER): Payer: Self-pay | Admitting: Family Medicine

## 2024-02-20 VITALS — BP 116/77 | HR 78 | Temp 98.0°F | Ht 65.0 in | Wt 195.0 lb

## 2024-02-20 DIAGNOSIS — E669 Obesity, unspecified: Secondary | ICD-10-CM | POA: Diagnosis not present

## 2024-02-20 DIAGNOSIS — F5089 Other specified eating disorder: Secondary | ICD-10-CM

## 2024-02-20 DIAGNOSIS — Z6832 Body mass index (BMI) 32.0-32.9, adult: Secondary | ICD-10-CM

## 2024-02-20 DIAGNOSIS — Z6831 Body mass index (BMI) 31.0-31.9, adult: Secondary | ICD-10-CM

## 2024-02-20 DIAGNOSIS — F3289 Other specified depressive episodes: Secondary | ICD-10-CM

## 2024-02-20 DIAGNOSIS — E559 Vitamin D deficiency, unspecified: Secondary | ICD-10-CM

## 2024-02-20 DIAGNOSIS — R7303 Prediabetes: Secondary | ICD-10-CM

## 2024-02-20 MED ORDER — METFORMIN HCL 500 MG PO TABS: 500.0000 mg | ORAL_TABLET | Freq: Two times a day (BID) | ORAL | 0 refills | Status: DC

## 2024-02-20 MED ORDER — BUPROPION HCL ER (SR) 150 MG PO TB12: 150.0000 mg | ORAL_TABLET | Freq: Two times a day (BID) | ORAL | 0 refills | Status: DC

## 2024-02-20 MED ORDER — VITAMIN D (ERGOCALCIFEROL) 1.25 MG (50000 UNIT) PO CAPS: 50000.0000 [IU] | ORAL_CAPSULE | ORAL | 0 refills | Status: DC

## 2024-02-20 NOTE — Progress Notes (Signed)
 Office: [phone]  /  Fax: [phone]  WEIGHT SUMMARY AND BIOMETRICS  Anthropometric Measurements Height: 5' 5 (1.651 m) Weight: 195 lb (88.5 kg) BMI (Calculated): 32.45 Weight at Last Visit: 192 lb Weight Lost Since Last Visit: 0 Weight Gained Since Last Visit: 3 lb Starting Weight: 217 lb Total Weight Loss (lbs): 22 lb (9.979 kg) Peak Weight: 225 lb   Body Composition  Body Fat %: 42.3 % Fat Mass (lbs): 82.8 lbs Muscle Mass (lbs): 107.2 lbs Total Body Water (lbs): 82 lbs Visceral Fat Rating : 11   Other Clinical Data Fasting: yes Labs: no Today's Visit #: 80 Starting Date: 02/22/18    Chief Complaint: OBESITY    History of Present Illness Ariel Ellis is a 58 year old female with obesity and prediabetes who presents for obesity treatment and progress assessment.  She has been following a prescribed journaling plan with a daily intake of 1400 calories and 80 or more grams of protein, but adheres to this plan about 50% of the time. Despite engaging in walking and strengthening exercises for 60 minutes six days a week, she has gained three pounds in the last month.  Her hunger levels are normal, but stress eating is a challenge, particularly during exhausting periods such as market events, which disrupt her exercise routine. She acknowledges making some poor dietary choices in the past month.  For the upcoming Thanksgiving, she plans to manage her food intake by controlling alcohol consumption and being mindful of portion sizes, especially with leftovers. She anticipates cooking for the holiday and plans to avoid making excessive amounts of food.  Her current medication regimen includes bupropion  200 mg once daily, metformin , and vitamin D . She takes her first dose of medication at 8:30 AM and her second dose of metformin  at 1 PM. She occasionally forgets to take her vitamin D  and considers using a pill box to improve adherence.  In terms of physical activity,  she prefers exercising in the evenings, often after dinner, and alternates between walking the dog and using the treadmill. She experiences shin splints with treadmill incline and focuses on incorporating weights and squats into her routine, aiming for three times a week but currently achieving this once or twice weekly.  She discusses the use of sugar substitutes, expressing curiosity about their benefits compared to sugar. She is aware of the insulin  response associated with aspartame and prefers alternatives like Splenda and Truvia.      PHYSICAL EXAM:  Blood pressure 116/77, pulse 78, temperature 98 F (36.7 C), height 5' 5 (1.651 m), weight 195 lb (88.5 kg), last menstrual period 04/05/2012, SpO2 97%. Body mass index is 32.45 kg/m.  DIAGNOSTIC DATA REVIEWED:  BMET    Component Value Date/Time   NA 140 11/28/2023 0918   K 4.8 11/28/2023 0918   CL 104 11/28/2023 0918   CO2 20 11/28/2023 0918   GLUCOSE 90 11/28/2023 0918   GLUCOSE 81 05/02/2015 1405   BUN 20 11/28/2023 0918   CREATININE 0.92 11/28/2023 0918   CREATININE 0.70 05/02/2015 1405   CALCIUM 9.2 11/28/2023 0918   GFRNONAA 78 05/19/2020 0849   GFRAA 90 05/19/2020 0849   Lab Results  Component Value Date   HGBA1C 5.7 (H) 11/28/2023   HGBA1C 5.8 (H) 02/22/2018   Lab Results  Component Value Date   INSULIN  7.7 11/28/2023   INSULIN  13.1 02/22/2018   Lab Results  Component Value Date   TSH 2.280 03/11/2022   CBC    Component  Value Date/Time   WBC 4.0 03/11/2022 0824   WBC 5.7 05/02/2015 1405   RBC 4.62 03/11/2022 0824   RBC 4.64 05/02/2015 1405   HGB 13.2 03/11/2022 0824   HGB 13.1 05/17/2013 1518   HCT 40.9 03/11/2022 0824   PLT 230 03/11/2022 0824   MCV 89 03/11/2022 0824   MCH 28.6 03/11/2022 0824   MCH 28.4 05/02/2015 1405   MCHC 32.3 03/11/2022 0824   MCHC 32.8 05/02/2015 1405   RDW 13.2 03/11/2022 0824   Iron Studies No results found for: IRON, TIBC, FERRITIN, IRONPCTSAT Lipid Panel      Component Value Date/Time   CHOL 235 (H) 11/28/2023 0918   TRIG 128 11/28/2023 0918   HDL 71 11/28/2023 0918   CHOLHDL 3.5 01/06/2018 1130   CHOLHDL 2.9 05/02/2015 1405   VLDL 20 05/02/2015 1405   LDLCALC 142 (H) 11/28/2023 0918   Hepatic Function Panel     Component Value Date/Time   PROT 6.2 11/28/2023 0918   ALBUMIN 4.3 11/28/2023 0918   AST 17 11/28/2023 0918   ALT 23 11/28/2023 0918   ALKPHOS 105 11/28/2023 0918   BILITOT 0.2 11/28/2023 0918      Component Value Date/Time   TSH 2.280 03/11/2022 0824   Nutritional Lab Results  Component Value Date   VD25OH 36.7 11/28/2023   VD25OH 56.0 04/22/2023   VD25OH 52.7 10/28/2022     Assessment and Plan Assessment & Plan Obesity Management is ongoing with a focus on dietary adherence and exercise. She follows a 1400 calorie diet with 80+ grams of protein about 50% of the time. She engages in 60 minutes of walking and strengthening exercises six days a week. Recent weight gain of 3 pounds, likely due to water retention and holiday-related dietary choices. Stress eating is a challenge. Discussed strategies for managing Thanksgiving and holiday eating, including portion control and mindful eating. Consideration of increasing bupropion  dosage to manage cravings, especially during the holiday season. - Increased bupropion  to 150 mg twice daily. - Continue current exercise regimen, aiming for 3 times a week. - Consider using a weighted vest for walking to enhance exercise without causing shin splints. - Implement strategies for mindful eating during holidays, such as portion control and avoiding alcohol.  Prediabetes Managed with metformin  and lifestyle modifications. She is working on diet and exercise to manage prediabetes. No issues reported with metformin . - Continue metformin  as prescribed. - Continue dietary and exercise regimen for prediabetes management.  Emotional Eating Behaviors Managed with bupropion .  Consideration of increasing dosage to help with cravings and mood during the holiday season. - Increased bupropion  to 150 mg twice daily.  Vitamin D  deficiency She reports difficulty remembering to take vitamin D  regularly. - Consider using a pill box to improve adherence to vitamin D  supplementation.   Evalynn was counseled on the importance of maintaining healthy lifestyle habits, including balanced nutrition, regular physical activity, and behavioral modifications, while taking antiobesity medication.  Patient verbalized understanding that medication is an adjunct to, not a replacement for, lifestyle changes and that the long-term success and weight maintenance depend on continued adherence to these strategies.   Cortasia was informed of the importance of frequent follow up visits to maximize her success with intensive lifestyle modifications for her obesity and obesity related health conditions as recommended by USPSTF and CMS guidelines   Louann Penton, MD

## 2024-03-23 ENCOUNTER — Other Ambulatory Visit (INDEPENDENT_AMBULATORY_CARE_PROVIDER_SITE_OTHER): Payer: Self-pay | Admitting: Family Medicine

## 2024-04-02 ENCOUNTER — Ambulatory Visit (INDEPENDENT_AMBULATORY_CARE_PROVIDER_SITE_OTHER): Payer: Self-pay | Admitting: Family Medicine

## 2024-04-14 ENCOUNTER — Other Ambulatory Visit (INDEPENDENT_AMBULATORY_CARE_PROVIDER_SITE_OTHER): Payer: Self-pay | Admitting: Family Medicine

## 2024-05-02 ENCOUNTER — Ambulatory Visit (INDEPENDENT_AMBULATORY_CARE_PROVIDER_SITE_OTHER): Admitting: Family Medicine

## 2024-05-07 ENCOUNTER — Encounter (INDEPENDENT_AMBULATORY_CARE_PROVIDER_SITE_OTHER): Payer: Self-pay | Admitting: Family Medicine

## 2024-05-07 ENCOUNTER — Telehealth (INDEPENDENT_AMBULATORY_CARE_PROVIDER_SITE_OTHER): Admitting: Family Medicine

## 2024-05-07 VITALS — Ht 65.0 in | Wt 195.0 lb

## 2024-05-07 DIAGNOSIS — E559 Vitamin D deficiency, unspecified: Secondary | ICD-10-CM

## 2024-05-07 DIAGNOSIS — E669 Obesity, unspecified: Secondary | ICD-10-CM | POA: Diagnosis not present

## 2024-05-07 DIAGNOSIS — Z6832 Body mass index (BMI) 32.0-32.9, adult: Secondary | ICD-10-CM | POA: Diagnosis not present

## 2024-05-07 DIAGNOSIS — F3289 Other specified depressive episodes: Secondary | ICD-10-CM

## 2024-05-07 DIAGNOSIS — F5089 Other specified eating disorder: Secondary | ICD-10-CM

## 2024-05-07 DIAGNOSIS — R7303 Prediabetes: Secondary | ICD-10-CM | POA: Diagnosis not present

## 2024-05-07 MED ORDER — METFORMIN HCL 500 MG PO TABS: 500.0000 mg | ORAL_TABLET | Freq: Two times a day (BID) | ORAL | 0 refills | Status: AC

## 2024-05-07 MED ORDER — BUPROPION HCL ER (SR) 150 MG PO TB12: 150.0000 mg | ORAL_TABLET | Freq: Two times a day (BID) | ORAL | 0 refills | Status: AC

## 2024-05-07 MED ORDER — VITAMIN D (ERGOCALCIFEROL) 1.25 MG (50000 UNIT) PO CAPS: 50000.0000 [IU] | ORAL_CAPSULE | ORAL | 0 refills | Status: AC

## 2024-05-07 NOTE — Progress Notes (Signed)
 "  Office: [phone]  /  Fax: [phone]  WEIGHT SUMMARY AND BIOMETRICS  Anthropometric Measurements Height: 5' 5 (1.651 m) Weight: 195 lb (88.5 kg) (last ov) BMI (Calculated): 32.45 Weight at Last Visit: 195 lb Weight Lost Since Last Visit: 0 Weight Gained Since Last Visit: 0 Starting Weight: 217 lb Total Weight Loss (lbs): 22 lb (9.979 kg) Peak Weight: 225 lb   No data recorded Other Clinical Data Fasting: no Labs: no Today's Visit #: 56 Starting Date: 02/22/18    Chief Complaint: OBESITY  Virtual Visit via A/V Note  I connected with Ariel Ellis on 05/07/24 at  9:00 AM EST by audiovisual telehealth and verified that I am speaking with the correct person using two identifiers.  Location: Patient: home Provider: home   I discussed the limitations, risks, security and privacy concerns of performing an evaluation and management service by AV telehealth and the availability of in person appointments. I also discussed with the patient that there may be a patient responsible charge related to this service. The patient expressed understanding and agreed to proceed.     History of Present Illness Ariel Ellis is a 59 year old female with obesity and prediabetes who presents for a follow-up on her treatment plan.  She is currently on a journaling plan with a caloric intake of 1200 to 1400 calories and a protein intake of 80 or more grams per day, meeting these dietary requirements about 50% of the time. She exercises for 60 or more minutes twice a week, combining cardio and strengthening exercises, but is not tracking her calories or macronutrients. She is trying to eat the recommended amount of protein, include more vegetables, and avoid skipping meals.  She is taking metformin  500 mg twice a day for her prediabetea  She has a vitamin D  deficiency and is on prescription ergocalciferol  50,000 IU per week.  She has a diagnosis of emotional eating behaviors and is  being treated with Wellbutrin  SR 150 mg twice a day, which she reports is stable.  Her exercise routine has been disrupted due to family and physical challenges over the last two months. Her knee was impacted at the end of October, causing significant discomfort by Thanksgiving, which led her to reduce treadmill use. Her mother has been staying with her, occupying the room with the treadmill, and she has had business travel, making it difficult to maintain a consistent exercise routine.  Her joints have been hurting, which she attributes to the last five pounds of weight, the weather, or stress. She continues to hydrate.      PHYSICAL EXAM:  Height 5' 5 (1.651 m), weight 195 lb (88.5 kg), last menstrual period 04/05/2012. Body mass index is 32.45 kg/m.  DIAGNOSTIC DATA REVIEWED BY MYSELF TODAY:  BMET    Component Value Date/Time   NA 140 11/28/2023 0918   K 4.8 11/28/2023 0918   CL 104 11/28/2023 0918   CO2 20 11/28/2023 0918   GLUCOSE 90 11/28/2023 0918   GLUCOSE 81 05/02/2015 1405   BUN 20 11/28/2023 0918   CREATININE 0.92 11/28/2023 0918   CREATININE 0.70 05/02/2015 1405   CALCIUM 9.2 11/28/2023 0918   GFRNONAA 78 05/19/2020 0849   GFRAA 90 05/19/2020 0849   Lab Results  Component Value Date   HGBA1C 5.7 (H) 11/28/2023   HGBA1C 5.8 (H) 02/22/2018   Lab Results  Component Value Date   INSULIN  7.7 11/28/2023   INSULIN  13.1 02/22/2018   Lab Results  Component  Value Date   TSH 2.280 03/11/2022   CBC    Component Value Date/Time   WBC 4.0 03/11/2022 0824   WBC 5.7 05/02/2015 1405   RBC 4.62 03/11/2022 0824   RBC 4.64 05/02/2015 1405   HGB 13.2 03/11/2022 0824   HGB 13.1 05/17/2013 1518   HCT 40.9 03/11/2022 0824   PLT 230 03/11/2022 0824   MCV 89 03/11/2022 0824   MCH 28.6 03/11/2022 0824   MCH 28.4 05/02/2015 1405   MCHC 32.3 03/11/2022 0824   MCHC 32.8 05/02/2015 1405   RDW 13.2 03/11/2022 0824   Iron Studies No results found for: IRON, TIBC,  FERRITIN, IRONPCTSAT Lipid Panel     Component Value Date/Time   CHOL 235 (H) 11/28/2023 0918   TRIG 128 11/28/2023 0918   HDL 71 11/28/2023 0918   CHOLHDL 3.5 01/06/2018 1130   CHOLHDL 2.9 05/02/2015 1405   VLDL 20 05/02/2015 1405   LDLCALC 142 (H) 11/28/2023 0918   Hepatic Function Panel     Component Value Date/Time   PROT 6.2 11/28/2023 0918   ALBUMIN 4.3 11/28/2023 0918   AST 17 11/28/2023 0918   ALT 23 11/28/2023 0918   ALKPHOS 105 11/28/2023 0918   BILITOT 0.2 11/28/2023 0918      Component Value Date/Time   TSH 2.280 03/11/2022 0824   Nutritional Lab Results  Component Value Date   VD25OH 36.7 11/28/2023   VD25OH 56.0 04/22/2023   VD25OH 52.7 10/28/2022     Assessment and Plan Assessment & Plan Obesity Management includes Adipex plan with dietary restrictions of 1200-1400 calories and 80+ grams of protein. She meets these requirements about 50% of the time. Exercise routine disrupted due to knee pain and family obligations, but knee pain has improved. She plans to resume regular exercise routine once family obligations decrease. - Continue journaling plan with 1200-1400 calories and 80* gm protein - Encouraged resumption of regular exercise routine once family obligations decrease.  Prediabetes Managed with dietary modifications, increased exercise, and weight loss. She is on metformin  500 mg twice daily and requests a refill. Emphasis on reducing simple carbohydrates to manage blood glucose levels and reduce inflammation. - Refilled metformin  500 mg twice daily. - Continue dietary modifications to reduce simple carbohydrates.  Vitamin D  deficiency Managed with prescription ergocalciferol  50,000 IU weekly. She requests a refill. - Refilled ergocalciferol  50,000 IU weekly.  Emotional eating behaviors Managed with Wellbutrin  SR 150 mg twice daily. She is stable on this medication and requests a refill. - Refilled Wellbutrin  SR 150 mg twice daily.       Patients who are on anti-obesity medications are counseled on the importance of maintaining healthy lifestyle habits, including balanced nutrition, regular physical activity, and behavioral modifications,  Medication is an adjunct to, not a replacement for, lifestyle changes and that the long-term success and weight maintenance depend on continued adherence to these strategies.   Ariel Ellis was informed of the importance of frequent follow up visits to maximize her success with intensive lifestyle modifications for her obesity and obesity related health conditions as recommended by USPSTF and CMS guidelines  Louann Penton, MD   "
